# Patient Record
Sex: Female | Born: 1985 | Race: White | Hispanic: No | Marital: Married | Smoking: Never smoker
Health system: Southern US, Community
[De-identification: ages and names within clinical notes are randomized; demographics above are authoritative.]

## PROBLEM LIST (undated history)

## (undated) DIAGNOSIS — N281 Cyst of kidney, acquired: Secondary | ICD-10-CM

## (undated) DIAGNOSIS — E278 Other specified disorders of adrenal gland: Secondary | ICD-10-CM

## (undated) DIAGNOSIS — D649 Anemia, unspecified: Secondary | ICD-10-CM

## (undated) DIAGNOSIS — K900 Celiac disease: Secondary | ICD-10-CM

## (undated) HISTORY — DX: Cyst of kidney, acquired: N28.1

## (undated) HISTORY — PX: WISDOM TOOTH EXTRACTION: SHX21

## (undated) HISTORY — PX: BOWEL RESECTION: SHX1257

## (undated) HISTORY — PX: NO PAST SURGERIES: SHX2092

---

## 2019-03-07 LAB — OB RESULTS CONSOLE ANTIBODY SCREEN: Antibody Screen: NEGATIVE

## 2019-03-07 LAB — OB RESULTS CONSOLE GC/CHLAMYDIA
Chlamydia: NEGATIVE
Gonorrhea: NEGATIVE

## 2019-03-07 LAB — OB RESULTS CONSOLE ABO/RH: RH Type: POSITIVE

## 2019-03-07 LAB — OB RESULTS CONSOLE RUBELLA ANTIBODY, IGM: Rubella: IMMUNE

## 2019-03-07 LAB — OB RESULTS CONSOLE HEPATITIS B SURFACE ANTIGEN: Hepatitis B Surface Ag: NEGATIVE

## 2019-03-07 LAB — OB RESULTS CONSOLE RPR: RPR: NONREACTIVE

## 2019-03-07 LAB — OB RESULTS CONSOLE HIV ANTIBODY (ROUTINE TESTING): HIV: NONREACTIVE

## 2019-09-01 ENCOUNTER — Encounter (HOSPITAL_COMMUNITY): Payer: Self-pay | Admitting: Obstetrics and Gynecology

## 2019-09-01 ENCOUNTER — Inpatient Hospital Stay (HOSPITAL_COMMUNITY)
Admission: AD | Admit: 2019-09-01 | Discharge: 2019-09-01 | Disposition: A | Discharge status: Home / Self Care | Payer: BC Managed Care – PPO | Source: Ambulatory Visit | Attending: Obstetrics and Gynecology | Admitting: Obstetrics and Gynecology

## 2019-09-01 DIAGNOSIS — K9289 Other specified diseases of the digestive system: Secondary | ICD-10-CM | POA: Diagnosis not present

## 2019-09-01 DIAGNOSIS — O99613 Diseases of the digestive system complicating pregnancy, third trimester: Secondary | ICD-10-CM | POA: Diagnosis not present

## 2019-09-01 DIAGNOSIS — Z3689 Encounter for other specified antenatal screening: Secondary | ICD-10-CM

## 2019-09-01 DIAGNOSIS — Z3A34 34 weeks gestation of pregnancy: Secondary | ICD-10-CM | POA: Diagnosis not present

## 2019-09-01 DIAGNOSIS — O368131 Decreased fetal movements, third trimester, fetus 1: Secondary | ICD-10-CM | POA: Diagnosis present

## 2019-09-01 HISTORY — DX: Celiac disease: K90.0

## 2019-09-01 NOTE — MAU Provider Note (Addendum)
Patient [name] is a 34 y.o. G1P0  at 41w4dhere with complaints of decreased fetal movements. She denies vaginal bleeding, dysuria, pelvic pain, HA, blurry vision, floating spots, COVID like symptoms.   She has had an uncomplicated pregnancy thus far; no history of high blood pressure or diabetes.   She reports a healthy dietary intake; she reports that she drinks enough water. She does not feel dehydrated.  History     CSN: [id number] Arrival date and time: 09/01/19 1754   None     Chief Complaint  Patient presents with  . 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 She started to worrying about fetal movements yesterday. She felt hiccups yesterday but she was on her feet all day. She took a unisom two nights and wondered if perhaps that had sedated the baby. She was at work and was concerned; she sat down at 3:45/4 and rested and tried to feel movements and didn't feel anything. She called her OB and they told her to come in and get checked out.   She reports that she "never stops or sits"; she is a very active. She thinks that is why she hasn't felt baby moved.    OB History    Gravida  1   Para      Term      Preterm      AB      Living        SAB      TAB      Ectopic      Multiple      Live Births              Past Medical History:  Diagnosis Date  . Celiac disease     Past Surgical History:  Procedure Laterality Date  . BOWEL RESECTION     as child  . NO PAST SURGERIES      History reviewed. No pertinent family history.  Social History   Tobacco Use  . Smoking status: Never Smoker  . Smokeless tobacco: Never Used  Substance Use Topics  . Alcohol use: Never  . Drug use: Never    Allergies: No Known Allergies  Medications Prior to Admission  Medication Sig Dispense Refill Last Dose  . prenatal vitamin w/FE, FA (PRENATAL 1  + 1) 27-1 MG TABS tablet Take 1 tablet by mouth daily at 12 noon.   08/31/2019 at Unknown time    Review of Systems  Constitutional: Negative.   HENT: Negative.   Respiratory: Negative.   Gastrointestinal: Negative.   Genitourinary: Negative.   Musculoskeletal: Negative.   Neurological: Negative.   Hematological: Negative.   Psychiatric/Behavioral: Negative.    Physical Exam   Blood pressure 128/77, pulse 82, temperature 97.6 F (36.4 C), temperature source Oral, resp. rate 18, SpO2 98 %.  Physical Exam  Constitutional: She appears well-developed.  HENT:  Head: Normocephalic.  Eyes: Pupils are equal, round, and reactive to light.  Respiratory: Effort normal.  GI: Soft.  Musculoskeletal:        General: Normal range of motion.     Cervical back: Normal range of motion.  Neurological: She is alert.  Skin: Skin is warm and dry.    MAU Course  Procedures  MDM -NST: 135 bpm, mod var, present acel, neg decels, irregular contractions.   - Reassurance and guidance given on fetal monitoring;  patient feels frequent strong active movements while in MAU.  Assessment and Plan   1. NST (non-stress test) reactive    2. Patient stable for discharge; her next prenatal visit is Tuesday; which she plans to keep.   3. Reviewed fetal movements, warning signs of labor, kick counts.   4. Chart routed to [facility].  Mervyn Skeeters Lori-Ann Lindfors 09/01/2019, 7:18 PM

## 2019-09-01 NOTE — MAU Note (Signed)
Pt presents to MAU with c/o DFM. She has noticed that the baby has not moved very much in the last 2 days. She reports that she has tried eating and drinking, has felt some movement but not normal movements. Pt denies VB and LOF.

## 2019-09-01 NOTE — Discharge Instructions (Signed)
Fetal Movement Counts Patient Name: ________________________________________________ Patient Due Date: ____________________ What is a fetal movement count?  A fetal movement count is the number of times that you feel your baby move during a certain amount of time. This may also be called a fetal kick count. A fetal movement count is recommended for every pregnant woman. You may be asked to start counting fetal movements as early as week 28 of your pregnancy. Pay attention to when your baby is most active. You may notice your baby's sleep and wake cycles. You may also notice things that make your baby move more. You should do a fetal movement count:  When your baby is normally most active.  At the same time each day. A good time to count movements is while you are resting, after having something to eat and drink. How do I count fetal movements? 1. Find a quiet, comfortable area. Sit, or lie down on your side. 2. Write down the date, the start time and stop time, and the number of movements that you felt between those two times. Take this information with you to your health care visits. 3. Write down your start time when you feel the first movement. 4. Count kicks, flutters, swishes, rolls, and jabs. You should feel at least 10 movements. 5. You may stop counting after you have felt 10 movements, or if you have been counting for 2 hours. Write down the stop time. 6. If you do not feel 10 movements in 2 hours, contact your health care provider for further instructions. Your health care provider may want to do additional tests to assess your baby's well-being. Contact a health care provider if:  You feel fewer than 10 movements in 2 hours.  Your baby is not moving like he or she usually does. Date: ____________ Start time: ____________ Stop time: ____________ Movements: ____________ Date: ____________ Start time: ____________ Stop time: ____________ Movements: ____________ Date: ____________  Start time: ____________ Stop time: ____________ Movements: ____________ Date: ____________ Start time: ____________ Stop time: ____________ Movements: ____________ Date: ____________ Start time: ____________ Stop time: ____________ Movements: ____________ Date: ____________ Start time: ____________ Stop time: ____________ Movements: ____________ Date: ____________ Start time: ____________ Stop time: ____________ Movements: ____________ Date: ____________ Start time: ____________ Stop time: ____________ Movements: ____________ Date: ____________ Start time: ____________ Stop time: ____________ Movements: ____________ This information is not intended to replace advice given to you by your health care provider. Make sure you discuss any questions you have with your health care provider. Document Revised: 02/23/2019 Document Reviewed: 02/23/2019 Elsevier Patient Education  [place].

## 2019-09-12 LAB — OB RESULTS CONSOLE GBS: GBS: NEGATIVE

## 2019-09-21 ENCOUNTER — Encounter (HOSPITAL_COMMUNITY): Payer: Self-pay

## 2019-09-21 NOTE — Patient Instructions (Signed)
[name]  09/21/2019   Your procedure is scheduled on:  10/04/2019  Arrive at 28 at [facility] on [address] at [facility]  and [employer]. You are invited to use the FREE valet parking or use the Visitor's parking deck.  Pick up the phone at the desk and dial [phone].  Call this number if you have problems the morning of surgery: [phone]  Remember:   Do not eat food:(After Midnight) Desps de medianoche.  Do not drink clear liquids: (After Midnight) Desps de medianoche.  Take these medicines the morning of surgery with A SIP OF WATER:  none   Do not wear jewelry, make-up or nail polish.  Do not wear lotions, powders, or perfumes. Do not wear deodorant.  Do not shave 48 hours prior to surgery.  Do not bring valuables to the hospital.  [facility] is not   responsible for any belongings or valuables brought to the hospital.  Contacts, dentures or bridgework may not be worn into surgery.  Leave suitcase in the car. After surgery it may be brought to your room.  For patients admitted to the hospital, checkout time is 11:00 AM the day of              discharge.      Please read over the following fact sheets that you were given:     Preparing for Surgery

## 2019-09-22 ENCOUNTER — Encounter (HOSPITAL_COMMUNITY): Payer: Self-pay

## 2019-09-22 ENCOUNTER — Telehealth (HOSPITAL_COMMUNITY): Payer: Self-pay | Admitting: *Deleted

## 2019-09-22 NOTE — Telephone Encounter (Signed)
Preadmission screen  

## 2019-10-02 ENCOUNTER — Other Ambulatory Visit (HOSPITAL_COMMUNITY)
Admission: RE | Admit: 2019-10-02 | Discharge: 2019-10-02 | Disposition: A | Discharge status: Home / Self Care | Payer: BC Managed Care – PPO | Source: Ambulatory Visit | Attending: Obstetrics and Gynecology | Admitting: Obstetrics and Gynecology

## 2019-10-02 ENCOUNTER — Other Ambulatory Visit: Payer: Self-pay

## 2019-10-02 DIAGNOSIS — Z20822 Contact with and (suspected) exposure to covid-19: Secondary | ICD-10-CM | POA: Insufficient documentation

## 2019-10-02 DIAGNOSIS — Z01812 Encounter for preprocedural laboratory examination: Secondary | ICD-10-CM | POA: Diagnosis not present

## 2019-10-02 HISTORY — DX: Anemia, unspecified: D64.9

## 2019-10-02 LAB — CBC
HCT: 35.8 % — ABNORMAL LOW (ref 36.0–46.0)
Hemoglobin: 11.4 g/dL — ABNORMAL LOW (ref 12.0–15.0)
MCH: 27 pg (ref 26.0–34.0)
MCHC: 31.8 g/dL (ref 30.0–36.0)
MCV: 84.8 fL (ref 80.0–100.0)
Platelets: 249 10*3/uL (ref 150–400)
RBC: 4.22 MIL/uL (ref 3.87–5.11)
RDW: 13.6 % (ref 11.5–15.5)
WBC: 5.9 10*3/uL (ref 4.0–10.5)
nRBC: 0 % (ref 0.0–0.2)

## 2019-10-02 LAB — ABO/RH: ABO/RH(D): O POS

## 2019-10-02 LAB — SARS CORONAVIRUS 2 (TAT 6-24 HRS): SARS Coronavirus 2: NEGATIVE

## 2019-10-02 LAB — TYPE AND SCREEN
ABO/RH(D): O POS
Antibody Screen: NEGATIVE

## 2019-10-02 LAB — RPR: RPR Ser Ql: NONREACTIVE

## 2019-10-02 NOTE — MAU Note (Signed)
Asymptomatic, swab collected.  Lab called.

## 2019-10-04 ENCOUNTER — Inpatient Hospital Stay (HOSPITAL_COMMUNITY)
Admission: RE | Admit: 2019-10-04 | Payer: BC Managed Care – PPO | Source: Home / Self Care | Admitting: Obstetrics and Gynecology

## 2019-10-04 ENCOUNTER — Telehealth (HOSPITAL_COMMUNITY): Payer: Self-pay | Admitting: *Deleted

## 2019-10-04 ENCOUNTER — Encounter (HOSPITAL_COMMUNITY): Admission: RE | Payer: Self-pay | Source: Home / Self Care

## 2019-10-04 SURGERY — Surgical Case
Anesthesia: Regional

## 2019-10-04 NOTE — Telephone Encounter (Signed)
Preadmission screen  

## 2019-10-06 NOTE — H&P (Signed)
[name] is a 34 y.o. female presenting for IOL at term. Pregnancy complicated by Hx of celiac disease. She was scheduled for C/S due to breech. U/S in office 10/03/19 noted Vtx. OB History    Gravida  1   Para      Term      Preterm      AB      Living        SAB      TAB      Ectopic      Multiple      Live Births             Past Medical History:  Diagnosis Date  . Anemia   . Celiac disease    Past Surgical History:  Procedure Laterality Date  . BOWEL RESECTION     as child  . NO PAST SURGERIES    . WISDOM TOOTH EXTRACTION     Family History: family history includes Cancer in her mother. Social History:  reports that she has never smoked. She has never used smokeless tobacco. She reports that she does not drink alcohol or use drugs.     Maternal Diabetes: No Genetic Screening: Normal Maternal Ultrasounds/Referrals: Normal Fetal Ultrasounds or other Referrals:  None Maternal Substance Abuse:  No Significant Maternal Medications:  None Significant Maternal Lab Results:  Group B Strep negative Other Comments:  None  Review of Systems  Eyes: Negative for visual disturbance.  Gastrointestinal: Negative for abdominal pain.  Neurological: Negative for headaches.   Maternal Medical History:  Fetal activity: Perceived fetal activity is normal.        There were no vitals taken for this visit. Exam Physical Exam  Cardiovascular: Normal rate.  Respiratory: Effort normal.  GI: Soft.   Cx 0/thick/-2 in office 10/03/19  Prenatal labs: ABO, Rh: --/--/O POS, O POS Performed at [facility], [place] [address], [place], [place] [address]  [phone]) Antibody: NEG (03/15 0848) Rubella: Immune (08/18 0000) RPR: NON REACTIVE (03/15 0846)  HBsAg: Negative (08/18 0000)  HIV: Non-reactive (08/18 0000)  GBS: Negative/-- (02/23 0000)   Assessment/Plan: 34 yo for two stage IOL   Shon Millet II 10/06/2019, 1:54 PM

## 2019-10-07 ENCOUNTER — Other Ambulatory Visit (HOSPITAL_COMMUNITY)
Admission: RE | Admit: 2019-10-07 | Discharge: 2019-10-07 | Disposition: A | Discharge status: Home / Self Care | Payer: BC Managed Care – PPO | Source: Ambulatory Visit | Attending: Obstetrics and Gynecology | Admitting: Obstetrics and Gynecology

## 2019-10-07 DIAGNOSIS — Z01812 Encounter for preprocedural laboratory examination: Secondary | ICD-10-CM | POA: Diagnosis not present

## 2019-10-07 DIAGNOSIS — Z20822 Contact with and (suspected) exposure to covid-19: Secondary | ICD-10-CM | POA: Insufficient documentation

## 2019-10-07 LAB — SARS CORONAVIRUS 2 (TAT 6-24 HRS): SARS Coronavirus 2: NEGATIVE

## 2019-10-09 ENCOUNTER — Encounter (HOSPITAL_COMMUNITY)
Admission: AD | Disposition: A | Discharge status: Home / Self Care | Payer: Self-pay | Source: Home / Self Care | Attending: Obstetrics and Gynecology

## 2019-10-09 ENCOUNTER — Encounter (HOSPITAL_COMMUNITY): Payer: Self-pay | Admitting: Obstetrics and Gynecology

## 2019-10-09 ENCOUNTER — Inpatient Hospital Stay (HOSPITAL_COMMUNITY): Payer: BC Managed Care – PPO

## 2019-10-09 ENCOUNTER — Inpatient Hospital Stay (HOSPITAL_COMMUNITY)
Admission: AD | Admit: 2019-10-09 | Discharge: 2019-10-11 | DRG: 788 | Disposition: A | Discharge status: Home / Self Care | Payer: BC Managed Care – PPO | Attending: Obstetrics and Gynecology | Admitting: Obstetrics and Gynecology

## 2019-10-09 ENCOUNTER — Inpatient Hospital Stay (HOSPITAL_COMMUNITY): Payer: BC Managed Care – PPO | Admitting: Anesthesiology

## 2019-10-09 ENCOUNTER — Other Ambulatory Visit: Payer: Self-pay

## 2019-10-09 DIAGNOSIS — O9962 Diseases of the digestive system complicating childbirth: Secondary | ICD-10-CM | POA: Diagnosis present

## 2019-10-09 DIAGNOSIS — O9902 Anemia complicating childbirth: Secondary | ICD-10-CM | POA: Diagnosis present

## 2019-10-09 DIAGNOSIS — D649 Anemia, unspecified: Secondary | ICD-10-CM | POA: Diagnosis present

## 2019-10-09 DIAGNOSIS — O328XX0 Maternal care for other malpresentation of fetus, not applicable or unspecified: Secondary | ICD-10-CM | POA: Diagnosis present

## 2019-10-09 DIAGNOSIS — Z3A40 40 weeks gestation of pregnancy: Secondary | ICD-10-CM

## 2019-10-09 DIAGNOSIS — Z3490 Encounter for supervision of normal pregnancy, unspecified, unspecified trimester: Secondary | ICD-10-CM

## 2019-10-09 DIAGNOSIS — O26893 Other specified pregnancy related conditions, third trimester: Secondary | ICD-10-CM | POA: Diagnosis present

## 2019-10-09 DIAGNOSIS — K900 Celiac disease: Secondary | ICD-10-CM | POA: Diagnosis present

## 2019-10-09 LAB — CBC
HCT: 36.4 % (ref 36.0–46.0)
HCT: 32.9 % — ABNORMAL LOW (ref 36.0–46.0)
Hemoglobin: 11.8 g/dL — ABNORMAL LOW (ref 12.0–15.0)
Hemoglobin: 10.8 g/dL — ABNORMAL LOW (ref 12.0–15.0)
MCH: 27.4 pg (ref 26.0–34.0)
MCH: 27.8 pg (ref 26.0–34.0)
MCHC: 32.4 g/dL (ref 30.0–36.0)
MCHC: 32.8 g/dL (ref 30.0–36.0)
MCV: 84.7 fL (ref 80.0–100.0)
MCV: 84.6 fL (ref 80.0–100.0)
Platelets: 255 10*3/uL (ref 150–400)
Platelets: 247 10*3/uL (ref 150–400)
RBC: 4.3 MIL/uL (ref 3.87–5.11)
RBC: 3.89 MIL/uL (ref 3.87–5.11)
RDW: 13.9 % (ref 11.5–15.5)
RDW: 13.9 % (ref 11.5–15.5)
WBC: 7.9 10*3/uL (ref 4.0–10.5)
WBC: 13.4 10*3/uL — ABNORMAL HIGH (ref 4.0–10.5)
nRBC: 0 % (ref 0.0–0.2)
nRBC: 0 % (ref 0.0–0.2)

## 2019-10-09 LAB — RPR: RPR Ser Ql: NONREACTIVE

## 2019-10-09 LAB — TYPE AND SCREEN
ABO/RH(D): O POS
Antibody Screen: NEGATIVE

## 2019-10-09 SURGERY — Surgical Case
Anesthesia: Spinal | Wound class: Clean Contaminated

## 2019-10-09 MED ORDER — METOCLOPRAMIDE HCL 5 MG/ML IJ SOLN
INTRAMUSCULAR | Status: AC
  Filled 2019-10-09: qty 2

## 2019-10-09 MED ORDER — OXYCODONE-ACETAMINOPHEN 5-325 MG PO TABS: 2.0000 | ORAL_TABLET | ORAL | Status: DC | PRN

## 2019-10-09 MED ORDER — OXYTOCIN 40 UNITS IN NORMAL SALINE INFUSION - SIMPLE MED: INTRAVENOUS | Status: DC | PRN

## 2019-10-09 MED ORDER — FENTANYL CITRATE (PF) 100 MCG/2ML IJ SOLN
INTRAMUSCULAR | Status: AC
  Filled 2019-10-09: qty 2

## 2019-10-09 MED ORDER — DIBUCAINE (PERIANAL) 1 % EX OINT: 1.0000 "application " | TOPICAL_OINTMENT | CUTANEOUS | Status: DC | PRN

## 2019-10-09 MED ORDER — NALBUPHINE HCL 10 MG/ML IJ SOLN: 5.0000 mg | Freq: Once | INTRAMUSCULAR | Status: DC | PRN

## 2019-10-09 MED ORDER — OXYTOCIN 40 UNITS IN NORMAL SALINE INFUSION - SIMPLE MED
INTRAVENOUS | Status: AC
  Filled 2019-10-09: qty 1000

## 2019-10-09 MED ORDER — MISOPROSTOL 25 MCG QUARTER TABLET: 25.0000 ug | ORAL_TABLET | ORAL | Status: DC | PRN

## 2019-10-09 MED ORDER — SIMETHICONE 80 MG PO CHEW
80.0000 mg | CHEWABLE_TABLET | Freq: Three times a day (TID) | ORAL | Status: DC
  Administered 2019-10-09 – 2019-10-11 (×7): 80 mg via ORAL
  Filled 2019-10-09 (×7): qty 1

## 2019-10-09 MED ORDER — FENTANYL CITRATE (PF) 100 MCG/2ML IJ SOLN
INTRAMUSCULAR | Status: DC | PRN
  Administered 2019-10-09: 15 ug via INTRATHECAL

## 2019-10-09 MED ORDER — DIPHENHYDRAMINE HCL 50 MG/ML IJ SOLN
INTRAMUSCULAR | Status: AC
  Filled 2019-10-09: qty 1

## 2019-10-09 MED ORDER — PHENYLEPHRINE 40 MCG/ML (10ML) SYRINGE FOR IV PUSH (FOR BLOOD PRESSURE SUPPORT)
PREFILLED_SYRINGE | INTRAVENOUS | Status: AC
  Filled 2019-10-09: qty 10

## 2019-10-09 MED ORDER — NALOXONE HCL 0.4 MG/ML IJ SOLN: 0.4000 mg | INTRAMUSCULAR | Status: DC | PRN

## 2019-10-09 MED ORDER — DIPHENHYDRAMINE HCL 50 MG/ML IJ SOLN: 12.5000 mg | INTRAMUSCULAR | Status: DC | PRN

## 2019-10-09 MED ORDER — METOCLOPRAMIDE HCL 5 MG/ML IJ SOLN
INTRAMUSCULAR | Status: DC | PRN
  Administered 2019-10-09: 10 mg via INTRAVENOUS

## 2019-10-09 MED ORDER — EPHEDRINE 5 MG/ML INJ
INTRAVENOUS | Status: AC
  Filled 2019-10-09: qty 10

## 2019-10-09 MED ORDER — DIPHENHYDRAMINE HCL 25 MG PO CAPS: 25.0000 mg | ORAL_CAPSULE | ORAL | Status: DC | PRN

## 2019-10-09 MED ORDER — SODIUM CHLORIDE 0.9 % IV SOLN: INTRAVENOUS | Status: DC | PRN

## 2019-10-09 MED ORDER — ZOLPIDEM TARTRATE 5 MG PO TABS: 5.0000 mg | ORAL_TABLET | Freq: Every evening | ORAL | Status: DC | PRN

## 2019-10-09 MED ORDER — ONDANSETRON HCL 4 MG/2ML IJ SOLN
INTRAMUSCULAR | Status: AC
  Filled 2019-10-09: qty 2

## 2019-10-09 MED ORDER — ACETAMINOPHEN 500 MG PO TABS
1000.0000 mg | ORAL_TABLET | Freq: Four times a day (QID) | ORAL | Status: DC
  Administered 2019-10-09 – 2019-10-11 (×9): 1000 mg via ORAL
  Filled 2019-10-09 (×10): qty 2

## 2019-10-09 MED ORDER — STERILE WATER FOR IRRIGATION IR SOLN
Status: DC | PRN
  Administered 2019-10-09: 1000 mL

## 2019-10-09 MED ORDER — LACTATED RINGERS IV SOLN: INTRAVENOUS | Status: DC | PRN

## 2019-10-09 MED ORDER — CEFAZOLIN SODIUM-DEXTROSE 2-4 GM/100ML-% IV SOLN
2.0000 g | Freq: Once | INTRAVENOUS | Status: AC
  Administered 2019-10-09: 2 g via INTRAVENOUS

## 2019-10-09 MED ORDER — WITCH HAZEL-GLYCERIN EX PADS: 1.0000 "application " | MEDICATED_PAD | CUTANEOUS | Status: DC | PRN

## 2019-10-09 MED ORDER — OXYTOCIN 40 UNITS IN NORMAL SALINE INFUSION - SIMPLE MED: 2.5000 [IU]/h | INTRAVENOUS | Status: AC

## 2019-10-09 MED ORDER — SIMETHICONE 80 MG PO CHEW
80.0000 mg | CHEWABLE_TABLET | ORAL | Status: DC
  Administered 2019-10-09 – 2019-10-10 (×2): 80 mg via ORAL
  Filled 2019-10-09 (×2): qty 1

## 2019-10-09 MED ORDER — MORPHINE SULFATE (PF) 0.5 MG/ML IJ SOLN
INTRAMUSCULAR | Status: AC
  Filled 2019-10-09: qty 10

## 2019-10-09 MED ORDER — OXYCODONE HCL 5 MG PO TABS
5.0000 mg | ORAL_TABLET | ORAL | Status: DC | PRN
  Administered 2019-10-10 – 2019-10-11 (×4): 5 mg via ORAL
  Filled 2019-10-09 (×4): qty 1

## 2019-10-09 MED ORDER — TETANUS-DIPHTH-ACELL PERTUSSIS 5-2.5-18.5 LF-MCG/0.5 IM SUSP: 0.5000 mL | Freq: Once | INTRAMUSCULAR | Status: DC

## 2019-10-09 MED ORDER — IBUPROFEN 800 MG PO TABS
800.0000 mg | ORAL_TABLET | Freq: Four times a day (QID) | ORAL | Status: DC
  Administered 2019-10-10 – 2019-10-11 (×6): 800 mg via ORAL
  Filled 2019-10-09 (×6): qty 1

## 2019-10-09 MED ORDER — ONDANSETRON HCL 4 MG/2ML IJ SOLN
INTRAMUSCULAR | Status: DC | PRN
  Administered 2019-10-09: 4 mg via INTRAVENOUS

## 2019-10-09 MED ORDER — TERBUTALINE SULFATE 1 MG/ML IJ SOLN: 0.2500 mg | Freq: Once | INTRAMUSCULAR | Status: DC | PRN

## 2019-10-09 MED ORDER — LACTATED RINGERS IV SOLN: 500.0000 mL | INTRAVENOUS | Status: DC | PRN

## 2019-10-09 MED ORDER — FENTANYL CITRATE (PF) 100 MCG/2ML IJ SOLN: 50.0000 ug | INTRAMUSCULAR | Status: DC | PRN

## 2019-10-09 MED ORDER — NALOXONE HCL 4 MG/10ML IJ SOLN
1.0000 ug/kg/h | INTRAVENOUS | Status: DC | PRN
  Filled 2019-10-09: qty 5

## 2019-10-09 MED ORDER — MORPHINE SULFATE (PF) 0.5 MG/ML IJ SOLN
INTRAMUSCULAR | Status: DC | PRN
  Administered 2019-10-09: .15 mg via INTRATHECAL

## 2019-10-09 MED ORDER — BUPIVACAINE IN DEXTROSE 0.75-8.25 % IT SOLN
INTRATHECAL | Status: DC | PRN
  Administered 2019-10-09: 1.5 mL via INTRATHECAL

## 2019-10-09 MED ORDER — PHENYLEPHRINE HCL-NACL 20-0.9 MG/250ML-% IV SOLN
INTRAVENOUS | Status: AC
  Filled 2019-10-09: qty 250

## 2019-10-09 MED ORDER — LACTATED RINGERS IV SOLN: INTRAVENOUS | Status: DC

## 2019-10-09 MED ORDER — KETOROLAC TROMETHAMINE 30 MG/ML IJ SOLN: 30.0000 mg | Freq: Four times a day (QID) | INTRAMUSCULAR | Status: DC | PRN

## 2019-10-09 MED ORDER — OXYCODONE-ACETAMINOPHEN 5-325 MG PO TABS: 1.0000 | ORAL_TABLET | ORAL | Status: DC | PRN

## 2019-10-09 MED ORDER — MORPHINE SULFATE (PF) 0.5 MG/ML IJ SOLN: INTRAMUSCULAR | Status: DC | PRN

## 2019-10-09 MED ORDER — ONDANSETRON HCL 4 MG/2ML IJ SOLN: 4.0000 mg | Freq: Three times a day (TID) | INTRAMUSCULAR | Status: DC | PRN

## 2019-10-09 MED ORDER — LACTATED RINGERS IV SOLN
INTRAVENOUS | Status: DC
  Administered 2019-10-09: 125 mL/h via INTRAVENOUS

## 2019-10-09 MED ORDER — NALBUPHINE HCL 10 MG/ML IJ SOLN: 5.0000 mg | INTRAMUSCULAR | Status: DC | PRN

## 2019-10-09 MED ORDER — DIPHENHYDRAMINE HCL 50 MG/ML IJ SOLN
INTRAMUSCULAR | Status: DC | PRN
  Administered 2019-10-09: 25 mg via INTRAVENOUS

## 2019-10-09 MED ORDER — SCOPOLAMINE 1 MG/3DAYS TD PT72
1.0000 | MEDICATED_PATCH | Freq: Once | TRANSDERMAL | Status: DC
  Administered 2019-10-09: 1.5 mg via TRANSDERMAL
  Filled 2019-10-09: qty 1

## 2019-10-09 MED ORDER — OXYTOCIN BOLUS FROM INFUSION: 500.0000 mL | Freq: Once | INTRAVENOUS | Status: DC

## 2019-10-09 MED ORDER — EPHEDRINE SULFATE 50 MG/ML IJ SOLN
INTRAMUSCULAR | Status: DC | PRN
  Administered 2019-10-09: 5 mg via INTRAVENOUS

## 2019-10-09 MED ORDER — MENTHOL 3 MG MT LOZG: 1.0000 | LOZENGE | OROMUCOSAL | Status: DC | PRN

## 2019-10-09 MED ORDER — SENNOSIDES-DOCUSATE SODIUM 8.6-50 MG PO TABS
2.0000 | ORAL_TABLET | ORAL | Status: DC
  Administered 2019-10-09 – 2019-10-10 (×2): 2 via ORAL
  Filled 2019-10-09 (×2): qty 2

## 2019-10-09 MED ORDER — SOD CITRATE-CITRIC ACID 500-334 MG/5ML PO SOLN
30.0000 mL | ORAL | Status: DC | PRN
  Administered 2019-10-09: 30 mL via ORAL
  Filled 2019-10-09: qty 30

## 2019-10-09 MED ORDER — OXYTOCIN 40 UNITS IN NORMAL SALINE INFUSION - SIMPLE MED: 2.5000 [IU]/h | INTRAVENOUS | Status: DC

## 2019-10-09 MED ORDER — SODIUM CHLORIDE 0.9 % IR SOLN
Status: DC | PRN
  Administered 2019-10-09: 1000 mL

## 2019-10-09 MED ORDER — SIMETHICONE 80 MG PO CHEW: 80.0000 mg | CHEWABLE_TABLET | ORAL | Status: DC | PRN

## 2019-10-09 MED ORDER — KETOROLAC TROMETHAMINE 30 MG/ML IJ SOLN
30.0000 mg | Freq: Four times a day (QID) | INTRAMUSCULAR | Status: AC
  Administered 2019-10-09 (×4): 30 mg via INTRAVENOUS
  Filled 2019-10-09 (×4): qty 1

## 2019-10-09 MED ORDER — FLEET ENEMA 7-19 GM/118ML RE ENEM: 1.0000 | ENEMA | RECTAL | Status: DC | PRN

## 2019-10-09 MED ORDER — ONDANSETRON HCL 4 MG/2ML IJ SOLN: 4.0000 mg | Freq: Four times a day (QID) | INTRAMUSCULAR | Status: DC | PRN

## 2019-10-09 MED ORDER — ACETAMINOPHEN 325 MG PO TABS: 650.0000 mg | ORAL_TABLET | ORAL | Status: DC | PRN

## 2019-10-09 MED ORDER — DIPHENHYDRAMINE HCL 25 MG PO CAPS: 25.0000 mg | ORAL_CAPSULE | Freq: Four times a day (QID) | ORAL | Status: DC | PRN

## 2019-10-09 MED ORDER — HYDROMORPHONE HCL 1 MG/ML IJ SOLN: 0.2000 mg | INTRAMUSCULAR | Status: DC | PRN

## 2019-10-09 MED ORDER — PHENYLEPHRINE HCL-NACL 20-0.9 MG/250ML-% IV SOLN
INTRAVENOUS | Status: DC | PRN
  Administered 2019-10-09: 30 ug/min via INTRAVENOUS

## 2019-10-09 MED ORDER — OXYTOCIN 10 UNIT/ML IJ SOLN
INTRAMUSCULAR | Status: DC | PRN
  Administered 2019-10-09: 40 [IU] via INTRAMUSCULAR

## 2019-10-09 MED ORDER — LIDOCAINE HCL (PF) 1 % IJ SOLN: 30.0000 mL | INTRAMUSCULAR | Status: DC | PRN

## 2019-10-09 MED ORDER — PRENATAL MULTIVITAMIN CH
1.0000 | ORAL_TABLET | Freq: Every day | ORAL | Status: DC
  Administered 2019-10-09 – 2019-10-11 (×3): 1 via ORAL
  Filled 2019-10-09 (×3): qty 1

## 2019-10-09 MED ORDER — COCONUT OIL OIL: 1.0000 "application " | TOPICAL_OIL | Status: DC | PRN

## 2019-10-09 MED ORDER — SODIUM CHLORIDE 0.9% FLUSH: 3.0000 mL | INTRAVENOUS | Status: DC | PRN

## 2019-10-09 SURGICAL SUPPLY — 37 items
BENZOIN TINCTURE PRP APPL 2/3 (GAUZE/BANDAGES/DRESSINGS) ×3 IMPLANT
CHLORAPREP W/TINT 26ML (MISCELLANEOUS) ×3 IMPLANT
CLAMP CORD UMBIL (MISCELLANEOUS) IMPLANT
CLOSURE STERI-STRIP 1/2X4 (GAUZE/BANDAGES/DRESSINGS) ×1
CLOSURE WOUND 1/2 X4 (GAUZE/BANDAGES/DRESSINGS) ×1
CLOTH BEACON ORANGE TIMEOUT ST (SAFETY) ×3 IMPLANT
CLSR STERI-STRIP ANTIMIC 1/2X4 (GAUZE/BANDAGES/DRESSINGS) ×2 IMPLANT
DRSG OPSITE POSTOP 4X10 (GAUZE/BANDAGES/DRESSINGS) ×3 IMPLANT
ELECT REM PT RETURN 9FT ADLT (ELECTROSURGICAL) ×3
ELECTRODE REM PT RTRN 9FT ADLT (ELECTROSURGICAL) ×1 IMPLANT
EXTRACTOR VACUUM KIWI (MISCELLANEOUS) IMPLANT
GAUZE SPONGE 4X4 12PLY STRL LF (GAUZE/BANDAGES/DRESSINGS) ×6 IMPLANT
GLOVE BIO SURGEON STRL SZ 6.5 (GLOVE) ×2 IMPLANT
GLOVE BIO SURGEONS STRL SZ 6.5 (GLOVE) ×1
GLOVE BIOGEL PI IND STRL 6.5 (GLOVE) ×1 IMPLANT
GLOVE BIOGEL PI IND STRL 7.0 (GLOVE) ×2 IMPLANT
GLOVE BIOGEL PI INDICATOR 6.5 (GLOVE) ×2
GLOVE BIOGEL PI INDICATOR 7.0 (GLOVE) ×4
GOWN STRL REUS W/TWL LRG LVL3 (GOWN DISPOSABLE) ×6 IMPLANT
KIT ABG SYR 3ML LUER SLIP (SYRINGE) ×3 IMPLANT
NEEDLE HYPO 25X5/8 SAFETYGLIDE (NEEDLE) ×3 IMPLANT
NS IRRIG 1000ML POUR BTL (IV SOLUTION) ×3 IMPLANT
PACK C SECTION WH (CUSTOM PROCEDURE TRAY) ×3 IMPLANT
PAD ABD 7.5X8 STRL (GAUZE/BANDAGES/DRESSINGS) ×3 IMPLANT
PAD OB MATERNITY 4.3X12.25 (PERSONAL CARE ITEMS) ×3 IMPLANT
PENCIL SMOKE EVAC W/HOLSTER (ELECTROSURGICAL) ×3 IMPLANT
STRIP CLOSURE SKIN 1/2X4 (GAUZE/BANDAGES/DRESSINGS) ×2 IMPLANT
SUT PLAIN 0 NONE (SUTURE) IMPLANT
SUT PLAIN 2 0 (SUTURE) ×2
SUT PLAIN ABS 2-0 CT1 27XMFL (SUTURE) ×1 IMPLANT
SUT VIC AB 0 CT1 36 (SUTURE) ×3 IMPLANT
SUT VIC AB 0 CTX 36 (SUTURE) ×6
SUT VIC AB 0 CTX36XBRD ANBCTRL (SUTURE) ×3 IMPLANT
SUT VIC AB 4-0 PS2 27 (SUTURE) ×3 IMPLANT
TOWEL OR 17X24 6PK STRL BLUE (TOWEL DISPOSABLE) ×3 IMPLANT
TRAY FOLEY W/BAG SLVR 14FR LF (SET/KITS/TRAYS/PACK) IMPLANT
WATER STERILE IRR 1000ML POUR (IV SOLUTION) ×3 IMPLANT

## 2019-10-09 NOTE — Op Note (Signed)
PROCEDURE DATE: 10/09/19  PREOPERATIVE DIAGNOSIS: Intrauterine pregnancy at 40.0 wga, Indication: breech  POSTOPERATIVE DIAGNOSIS:The same  PROCEDURE: Primary Low TransverseCesarean Section  SURGEON: Dr. Lucillie Garfinkel  INDICATIONS:This is a 34yo G1P0 at 26.0 wga requiring cesarean section secondary to breech.  She presented for an IOL. At that time presenting part unable to be palpated. BSUS done and confirmed breech presentation with head in RUQ. She was counseled on ECV vs cesarean section and elected ECV.  Decision made to proceed with pLTCS.The risks of cesarean section discussed with the patient included but were not limited to: bleeding which may require transfusion or reoperation; infection which may require antibiotics; injury to bowel, bladder, ureters or other surrounding organs; injury to the fetus; need for additional procedures including hysterectomy in the event of a life-threatening hemorrhage; placental abnormalities wth subsequent pregnancies, incisional problems, thromboembolic phenomenon and other postoperative/anesthesia complications. The patient agreed with the proposed plan, giving informed consent for the procedure.   FINDINGS: Viable maleinfant in footling breech presentation,APGARs pending, Weight pending, Amniotic fluid clear, Intact placenta, three vessel cord. Grossly normal uterus.  .  ANESTHESIA: Epidural ESTIMATED BLOOD LOSS: final pending SPECIMENS: Placenta for routine COMPLICATIONS: None immediate  PROCEDURE IN DETAIL: The patient received intravenous antibiotics (2g Ancef) and had sequential compression devices applied to her lower extremities while in the preoperative area. Shewasthen taken to the operating roomwhere epidural anesthesiawas dosed up to surgical level andwas found to be adequate. She was then placed in a dorsal supine position with a leftward tilt,and prepped and draped in a sterile manner.A foley catheter was  placed into her bladder and attached to constant gravity. After an adequate timeout was performed, aPfannenstiel skin incision was made with scalpel and carried through to the underlying layer of fascia. The fascia was incised in the midline and this incision was extended bilaterally using the Mayo scissors. Kocher clamps were applied to the superior aspect of the fascial incision and the underlying rectus muscles were dissected off bluntly. A similar process was carried out on the inferior aspect of the facial incision. The rectus muscles were separated in the midline bluntly and the peritoneum was entered bluntly. A bladder flap was created sharply and developed bluntly.Atransverse hysterotomy was made with a scalpel and extended bilaterally bluntly. The bladder blade was then removed. The infant was successfully delivered via typical breech manuevers, and cord was clamped and cut and infant was handed over to awaiting neonatology team. Uterine massage was then administered and the placenta delivered intact with three-vessel cord. Cord gases were taken. The uterus was cleared of clot and debris. The hysterotomy was closed with 0 vicryl.A second imbricating suture of 0-vicryl was used to reinforce the incision and aid in hemostasis.The fascia was closed with 0-Vicryl in a running fashion with good restoration of anatomy. The subcutaneus tissue was irrigated. The skin was closed with 4-0 Vicryl in a subcuticular fashion.  All surgical site and was hemostatic at end of procedure) without any further bleeding on exam.   It's a boy - "Parker"!!    Pt tolerated the procedure well. All sponge/lap/needle counts were correct X 2. Pt taken to recovery room in stable condition.   Lucillie Garfinkel MD

## 2019-10-09 NOTE — Progress Notes (Addendum)
Patient arrived and scanned. Noted to be breech with head in RUQ. We discussed options including EVC vs primary C section. She declines ECV and desires primary C section.  Risks discussed including infection, bleeding, damage to surrounding structures, the need for additional procedures including hysterectomy, and the possibility of uterine rupture with neonatal morbidity/mortality, scarring, and abnormal placentation with subsequent pregnancies. Patient agrees to proceed.  2g ancef on call to OR.    Lucillie Garfinkel MD

## 2019-10-09 NOTE — Lactation Note (Signed)
This note was copied from a baby's chart. Lactation Consultation Note Baby is 3 hrs old. Mom holding baby swaddled. LC discussed importance of STS and I&O. Newborn behavior, feeding habits, supply and demand reviewed. Mom encouraged to feed baby 8-12 times/24 hours and with feeding cues.  Mom encouraged to waken baby for feedings in 3 hrs. If hasn't cued.  Mom sleepy after RN comes back for next fundal check mom is going to rest. Mom stated baby BF well after delivery. Denies painful latch.  Encouraged mom to call for assistance or questions. Lactation brochure given.  Patient Name: [name] [id number] Date: 10/09/2019 Reason for consult: Initial assessment;Primapara;Term   Maternal Data Has patient been taught Hand Expression?: Yes Does the patient have breastfeeding experience prior to this delivery?: No  Feeding Feeding Type: Breast Fed  LATCH Score Latch: Repeated attempts needed to sustain latch, nipple held in mouth throughout feeding, stimulation needed to elicit sucking reflex.  Audible Swallowing: A few with stimulation  Type of Nipple: Everted at rest and after stimulation  Comfort (Breast/Nipple): Soft / non-tender  Hold (Positioning): Assistance needed to correctly position infant at breast and maintain latch.  LATCH Score: 7  Interventions Interventions: Breast feeding basics reviewed;Position options  Lactation Tools Discussed/Used WIC Program: No   Consult Status Consult Status: Follow-up Date: 10/09/19 Follow-up type: In-patient    Theodoro Kalata 10/09/2019, 6:14 AM

## 2019-10-09 NOTE — Anesthesia Preprocedure Evaluation (Signed)
Anesthesia Evaluation  Patient identified by MRN, date of birth, ID band Patient awake    Reviewed: Allergy & Precautions, H&P , NPO status , Patient's Chart, lab work & pertinent test results, reviewed documented beta blocker date and time   Airway Mallampati: II  TM Distance: >3 FB Neck ROM: full    Dental no notable dental hx. (+) Teeth Intact, Dental Advisory Given   Pulmonary neg pulmonary ROS,    Pulmonary exam normal breath sounds clear to auscultation       Cardiovascular negative cardio ROS Normal cardiovascular exam Rhythm:regular Rate:Normal     Neuro/Psych negative neurological ROS  negative psych ROS   GI/Hepatic negative GI ROS, Neg liver ROS,   Endo/Other  negative endocrine ROS  Renal/GU negative Renal ROS  negative genitourinary   Musculoskeletal   Abdominal   Peds  Hematology  (+) Blood dyscrasia, anemia ,   Anesthesia Other Findings   Reproductive/Obstetrics (+) Pregnancy                             Anesthesia Physical Anesthesia Plan  ASA: II and emergent  Anesthesia Plan: Spinal   Post-op Pain Management:    Induction:   PONV Risk Score and Plan: 2 and Scopolamine patch - Pre-op  Airway Management Planned:   Additional Equipment:   Intra-op Plan:   Post-operative Plan:   Informed Consent: I have reviewed the patients History and Physical, chart, labs and discussed the procedure including the risks, benefits and alternatives for the proposed anesthesia with the patient or authorized representative who has indicated his/her understanding and acceptance.       Plan Discussed with: Anesthesiologist and CRNA  Anesthesia Plan Comments: (  )        Anesthesia Quick Evaluation

## 2019-10-09 NOTE — Anesthesia Procedure Notes (Signed)
Spinal  Patient location during procedure: OR Start time: 10/09/2019 2:20 AM End time: 10/09/2019 2:22 AM Staffing Anesthesiologist: Janeece Riggers, MD Preanesthetic Checklist Completed: patient identified, IV checked, site marked, risks and benefits discussed, surgical consent, monitors and equipment checked, pre-op evaluation and timeout performed Spinal Block Patient position: sitting Prep: DuraPrep Patient monitoring: heart rate, cardiac monitor, continuous pulse ox and blood pressure Approach: midline Location: L3-4 Injection technique: single-shot Needle Needle type: Sprotte  Needle gauge: 24 G Needle length: 9 cm Assessment Sensory level: T4

## 2019-10-09 NOTE — Transfer of Care (Signed)
Immediate Anesthesia Transfer of Care Note  Patient: [name]  Procedure(s) Performed: CESAREAN SECTION (N/A )  Patient Location: PACU  Anesthesia Type:Spinal  Level of Consciousness: awake, alert  and oriented  Airway & Oxygen Therapy: Patient Spontanous Breathing  Post-op Assessment: Post -op Vital signs reviewed and stable  Post vital signs: Reviewed and stable  Last Vitals:  Vitals Value Taken Time  BP 101/56 10/09/19 0328  Temp    Pulse 74 10/09/19 0330  Resp 20 10/09/19 0330  SpO2 100 % 10/09/19 0330  Vitals shown include unvalidated device data.  Last Pain:  Vitals:   10/09/19 0056  TempSrc:   PainSc: 0-No pain         Complications: No apparent anesthesia complications

## 2019-10-09 NOTE — Plan of Care (Signed)

## 2019-10-09 NOTE — Progress Notes (Signed)
Subjective: Postpartum Day 0: Cesarean Delivery Patient reports tolerating PO.    Objective: Vital signs in last 24 hours: Temp:  [97.2 F (36.2 C)-98.9 F (37.2 C)] 97.7 F (36.5 C) (03/22 0612) Pulse Rate:  [61-80] 68 (03/22 0612) Resp:  [14-23] 18 (03/22 0612) BP: (101-146)/(56-98) 126/77 (03/22 0612) SpO2:  [99 %-100 %] 99 % (03/22 0612) Weight:  [68.4 kg] 68.4 kg (03/22 0056)  Physical Exam:  General: alert, cooperative and no distress Lochia: appropriate Uterine Fundus: firm Incision: healing well DVT Evaluation: No evidence of DVT seen on physical exam.  Recent Labs    10/09/19 0030  HGB 11.8*  HCT 36.4    Assessment/Plan: Status post Cesarean section. Doing well postoperatively.  Continue current care.  Shon Millet II 10/09/2019, 7:13 AM

## 2019-10-10 NOTE — Anesthesia Postprocedure Evaluation (Signed)
Anesthesia Post Note  Patient: Deirdre Gryder  Procedure(s) Performed: CESAREAN SECTION (N/A )     Patient location during evaluation: PACU Anesthesia Type: Spinal Level of consciousness: oriented and awake and alert Pain management: pain level controlled Vital Signs Assessment: post-procedure vital signs reviewed and stable Respiratory status: spontaneous breathing, respiratory function stable and patient connected to nasal cannula oxygen Cardiovascular status: blood pressure returned to baseline and stable Postop Assessment: no headache, no backache and no apparent nausea or vomiting Anesthetic complications: no    Last Vitals:  Vitals:   10/09/19 2130 10/10/19 0500  BP: 104/67 110/77  Pulse: 60 60  Resp: 17 17  Temp:  36.7 C  SpO2: 99% 100%    Last Pain:  Vitals:   10/10/19 0500  TempSrc: Oral  PainSc: 0-No pain   Pain Goal:                   Gustaf Mccarter

## 2019-10-10 NOTE — Lactation Note (Signed)
This note was copied from a baby's chart. Lactation Consultation Note  Patient Name: [name] [id number] Date: 10/10/2019 Reason for consult: Follow-up assessment;1st time breastfeeding;Primapara;Term;Other (Comment)(post circ - sleepy)  Baby is 82 hours old / post circ / baby awake for short time to attempt to latch.  Mom requested try to latch / football position/ baby latched for short time and off .  Baby STS with mom.  LC described to mom post circ potential feedings .    Maternal Data Has patient been taught Hand Expression?: Yes  Feeding Feeding Type: Breast Fed  LATCH Score Latch: Repeated attempts needed to sustain latch, nipple held in mouth throughout feeding, stimulation needed to elicit sucking reflex.  Audible Swallowing: None  Type of Nipple: Everted at rest and after stimulation  Comfort (Breast/Nipple): Soft / non-tender  Hold (Positioning): Assistance needed to correctly position infant at breast and maintain latch.  LATCH Score: 6  Interventions Interventions: Breast feeding basics reviewed;Assisted with latch;Skin to skin;Breast massage;Hand express;Breast compression;Adjust position;Support pillows;Position options  Lactation Tools Discussed/Used     Consult Status Consult Status: Follow-up Date: 10/11/19 Follow-up type: In-patient    [place] 10/10/2019, 6:05 PM

## 2019-10-10 NOTE — Progress Notes (Signed)
Subjective: Postpartum Day 1: Cesarean Delivery Patient reports incisional pain, tolerating PO and no problems voiding.    Objective: Vital signs in last 24 hours: Temp:  [98 F (36.7 C)-98.3 F (36.8 C)] 98 F (36.7 C) (03/23 0500) Pulse Rate:  [60] 60 (03/23 0500) Resp:  [16-17] 17 (03/23 0500) BP: (104-110)/(59-77) 110/77 (03/23 0500) SpO2:  [98 %-100 %] 100 % (03/23 0500)  Physical Exam:  General: alert, cooperative, appears stated age and no distress Lochia: appropriate Uterine Fundus: firm Incision: healing well DVT Evaluation: No evidence of DVT seen on physical exam.  Recent Labs    10/09/19 0030 10/09/19 0606  HGB 11.8* 10.8*  HCT 36.4 32.9*    Assessment/Plan: Status post Cesarean section. Doing well postoperatively.  Continue current care Pt desires baby circumcision today.  [name] 10/10/2019, 9:32 AM

## 2019-10-10 NOTE — Progress Notes (Signed)
Dressing change performed.  Incision clean, dry, and intact with edges approximated.  New dressing applied without difficulty.

## 2019-10-11 MED ORDER — OXYCODONE HCL 5 MG PO TABS: 5.0000 mg | ORAL_TABLET | ORAL | 0 refills | Status: DC | PRN

## 2019-10-11 MED ORDER — IBUPROFEN 800 MG PO TABS: 800.0000 mg | ORAL_TABLET | Freq: Four times a day (QID) | ORAL | 0 refills | Status: DC

## 2019-10-11 MED ORDER — ACETAMINOPHEN 500 MG PO TABS: 1000.0000 mg | ORAL_TABLET | Freq: Four times a day (QID) | ORAL | 0 refills | Status: DC

## 2019-10-11 NOTE — Discharge Instructions (Signed)
Call MD for T>100.4, heavy vaginal bleeding, severe abdominal pain, intractable nausea and/or vomiting, or respiratory distress.  Call office to schedule postpartum visit in 6 weeks.  Pelvic rest x 6 weeks.  No driving while taking narcotics.  Remove honeycomb dressing by 1 week postop.

## 2019-10-11 NOTE — Progress Notes (Signed)
Subjective: Postpartum Day 2: Cesarean Delivery Patient reports tolerating PO, + flatus and no problems voiding.    Objective: Vital signs in last 24 hours: Temp:  [97.6 F (36.4 C)-97.7 F (36.5 C)] 97.6 F (36.4 C) (03/24 0509) Pulse Rate:  [61-68] 68 (03/24 0509) Resp:  [17-20] 20 (03/24 0509) BP: (106-122)/(72-79) 122/79 (03/24 0509) SpO2:  [100 %] 100 % (03/24 0509)  Physical Exam:  General: alert, cooperative and appears stated age Lochia: appropriate Uterine Fundus: firm Incision: healing well, no significant drainage, no dehiscence DVT Evaluation: No evidence of DVT seen on physical exam. Negative Homan's sign. No cords or calf tenderness.  Recent Labs    10/09/19 0030 10/09/19 0606  HGB 11.8* 10.8*  HCT 36.4 32.9*    Assessment/Plan: Status post Cesarean section. Doing well postoperatively.  Discharge home with standard precautions and return to clinic in 4-6 weeks.  Linda Hedges 10/11/2019, 9:35 AM

## 2019-10-11 NOTE — Lactation Note (Signed)
This note was copied from a baby's chart. Lactation Consultation Note  Patient Name: [name] Date: 10/11/2019 Reason for consult: Follow-up assessment;Primapara;Term  1102-LC attempted to visit but mom preparing to get up to bathroom  1142-1150 F/U visit with P44 mom, baby is now 69 hrs old with 7%wt loss; mom awaiting discharge instructions.  LC entered room to find mom packing up the room and dressing infant who is fussy. Mom states she just finished feeding for about 20 minutes, states baby is now cluster feeding.   Mom seems confident with breastfeeding and denies any pain with latching other than slight tenderness with initial latch.  Reviewed alternating breasts and positions with feeding. Reviewed prevention of engorgement and treatment with ice instead of heat and reinforced removal of milk. Reviewed OP lactation services and phone numbers after discharge. Reviewed expected onset of lactogenesis s/p C/S and expected output from baby.  Interventions Interventions: Skin to skin;Breast massage;Hand express;Expressed milk;Coconut oil;Pre-pump if needed  Consult Status Consult Status: Complete    Cranston Neighbor 10/11/2019, 12:07 PM

## 2019-10-11 NOTE — Discharge Summary (Signed)
Obstetric Discharge Summary Reason for Admission: induction of labor and found to be breech on admission Prenatal Procedures: none Intrapartum Procedures: cesarean: low cervical, transverse Postpartum Procedures: none Complications-Operative and Postpartum: none Hemoglobin  Date Value Ref Range Status  10/09/2019 10.8 (L) 12.0 - 15.0 g/dL Final   HCT  Date Value Ref Range Status  10/09/2019 32.9 (L) 36.0 - 46.0 % Final    Physical Exam:  General: alert, cooperative and appears stated age 34: appropriate Uterine Fundus: firm Incision: healing well, no significant drainage, no dehiscence DVT Evaluation: No evidence of DVT seen on physical exam. Negative Homan's sign. No cords or calf tenderness.  Discharge Diagnoses: Term Pregnancy-delivered  Discharge Information: Date: 10/11/2019 Activity: pelvic rest Diet: routine Medications: PNV, Ibuprofen and oxicodone Condition: stable Instructions: refer to practice specific booklet Discharge to: home   Newborn Data: Live born female  Birth Weight: 8 lb 5 oz (3770 g) APGAR: 72, 10  Newborn Delivery   Birth date/time: 10/09/2019 02:43:00 Delivery type: C-Section, Low Transverse Trial of labor: No C-section categorization: Primary      Home with mother.  Linda Hedges 10/11/2019, 9:39 AM

## 2020-01-04 DIAGNOSIS — F321 Major depressive disorder, single episode, moderate: Secondary | ICD-10-CM | POA: Insufficient documentation

## 2021-01-14 ENCOUNTER — Other Ambulatory Visit: Payer: Self-pay

## 2021-01-14 ENCOUNTER — Encounter (HOSPITAL_BASED_OUTPATIENT_CLINIC_OR_DEPARTMENT_OTHER): Payer: Self-pay | Admitting: *Deleted

## 2021-01-14 ENCOUNTER — Emergency Department (HOSPITAL_BASED_OUTPATIENT_CLINIC_OR_DEPARTMENT_OTHER)
Admission: EM | Admit: 2021-01-14 | Discharge: 2021-01-15 | Disposition: A | Discharge status: Home / Self Care | Payer: BC Managed Care – PPO | Attending: Emergency Medicine | Admitting: Emergency Medicine

## 2021-01-14 DIAGNOSIS — R509 Fever, unspecified: Secondary | ICD-10-CM | POA: Insufficient documentation

## 2021-01-14 DIAGNOSIS — N3000 Acute cystitis without hematuria: Secondary | ICD-10-CM

## 2021-01-14 DIAGNOSIS — R1110 Vomiting, unspecified: Secondary | ICD-10-CM | POA: Insufficient documentation

## 2021-01-14 DIAGNOSIS — M791 Myalgia, unspecified site: Secondary | ICD-10-CM | POA: Diagnosis not present

## 2021-01-14 DIAGNOSIS — Z20822 Contact with and (suspected) exposure to covid-19: Secondary | ICD-10-CM | POA: Diagnosis not present

## 2021-01-14 DIAGNOSIS — R519 Headache, unspecified: Secondary | ICD-10-CM | POA: Insufficient documentation

## 2021-01-14 LAB — URINALYSIS, ROUTINE W REFLEX MICROSCOPIC
Bilirubin Urine: NEGATIVE
Glucose, UA: NEGATIVE mg/dL
Ketones, ur: 15 mg/dL — AB
Nitrite: NEGATIVE
Protein, ur: 100 mg/dL — AB
Specific Gravity, Urine: 1.01 (ref 1.005–1.030)
pH: 6 (ref 5.0–8.0)

## 2021-01-14 LAB — CBC WITH DIFFERENTIAL/PLATELET
Abs Immature Granulocytes: 0.04 10*3/uL (ref 0.00–0.07)
Basophils Absolute: 0 10*3/uL (ref 0.0–0.1)
Basophils Relative: 0 %
Eosinophils Absolute: 0 10*3/uL (ref 0.0–0.5)
Eosinophils Relative: 0 %
HCT: 39.2 % (ref 36.0–46.0)
Hemoglobin: 12.9 g/dL (ref 12.0–15.0)
Immature Granulocytes: 0 %
Lymphocytes Relative: 7 %
Lymphs Abs: 0.8 10*3/uL (ref 0.7–4.0)
MCH: 27.2 pg (ref 26.0–34.0)
MCHC: 32.9 g/dL (ref 30.0–36.0)
MCV: 82.5 fL (ref 80.0–100.0)
Monocytes Absolute: 0.7 10*3/uL (ref 0.1–1.0)
Monocytes Relative: 6 %
Neutro Abs: 10 10*3/uL — ABNORMAL HIGH (ref 1.7–7.7)
Neutrophils Relative %: 87 %
Platelets: 226 10*3/uL (ref 150–400)
RBC: 4.75 MIL/uL (ref 3.87–5.11)
RDW: 13.5 % (ref 11.5–15.5)
WBC: 11.6 10*3/uL — ABNORMAL HIGH (ref 4.0–10.5)
nRBC: 0 % (ref 0.0–0.2)

## 2021-01-14 LAB — RESP PANEL BY RT-PCR (FLU A&B, COVID) ARPGX2
Influenza A by PCR: NEGATIVE
Influenza B by PCR: NEGATIVE
SARS Coronavirus 2 by RT PCR: NEGATIVE

## 2021-01-14 LAB — BASIC METABOLIC PANEL
Anion gap: 9 (ref 5–15)
BUN: 13 mg/dL (ref 6–20)
CO2: 25 mmol/L (ref 22–32)
Calcium: 9.2 mg/dL (ref 8.9–10.3)
Chloride: 100 mmol/L (ref 98–111)
Creatinine, Ser: 0.79 mg/dL (ref 0.44–1.00)
GFR, Estimated: >60 mL/min (ref >60)
Glucose, Bld: 128 mg/dL — ABNORMAL HIGH (ref 70–99)
Potassium: 3.9 mmol/L (ref 3.5–5.1)
Sodium: 134 mmol/L — ABNORMAL LOW (ref 135–145)

## 2021-01-14 LAB — URINALYSIS, MICROSCOPIC (REFLEX): WBC, UA: >50 WBC/hpf (ref 0–5)

## 2021-01-14 LAB — PREGNANCY, URINE: Preg Test, Ur: NEGATIVE

## 2021-01-14 MED ORDER — CEPHALEXIN 500 MG PO CAPS: 500.0000 mg | ORAL_CAPSULE | Freq: Four times a day (QID) | ORAL | 0 refills | Status: DC

## 2021-01-14 MED ORDER — SODIUM CHLORIDE 0.9 % IV SOLN
1.0000 g | Freq: Once | INTRAVENOUS | Status: AC
  Administered 2021-01-14: 1 g via INTRAVENOUS
  Filled 2021-01-14: qty 10

## 2021-01-14 MED ORDER — ONDANSETRON 8 MG PO TBDP: 8.0000 mg | ORAL_TABLET | Freq: Three times a day (TID) | ORAL | 0 refills | Status: DC | PRN

## 2021-01-14 MED ORDER — ACETAMINOPHEN 325 MG PO TABS
650.0000 mg | ORAL_TABLET | Freq: Once | ORAL | Status: AC
  Administered 2021-01-14: 650 mg via ORAL
  Filled 2021-01-14: qty 2

## 2021-01-14 MED ORDER — SODIUM CHLORIDE 0.9 % IV BOLUS
1000.0000 mL | Freq: Once | INTRAVENOUS | Status: AC
  Administered 2021-01-14: 1000 mL via INTRAVENOUS

## 2021-01-14 NOTE — ED Triage Notes (Addendum)
Vomiting x 2 days. No pain. Fever 103. Covid test was negative. She feels she has the flu.

## 2021-01-14 NOTE — ED Notes (Addendum)
Pt. Treated for UTI Feb 2022.

## 2021-01-15 NOTE — ED Provider Notes (Signed)
[facility]GENCY DEPARTMENT Provider Note   CSN: [id number] Arrival date & time: 01/14/21  1918     History Chief Complaint  Patient presents with   Emesis    [name] is a 35 y.o. female.  The history is provided by the patient.  Emesis Severity:  Moderate Duration:  2 days Timing:  Intermittent Progression:  Worsening Chronicity:  New Relieved by:  Nothing Worsened by:  Nothing Associated symptoms: chills, fever, headaches and myalgias   Associated symptoms: no abdominal pain, no cough and no diarrhea   Patient is an otherwise healthy 35 year old who presents with flulike symptoms.  She reports for the past 2 days she has had vomiting, headaches, fevers and chills and myalgias.  No cough or shortness of breath.  She also reports dysuria approximately 2 days ago.  No vaginal bleeding or discharge.  No significant abdominal or back pain.     Past Medical History:  Diagnosis Date   Anemia    Celiac disease     Patient Active Problem List   Diagnosis Date Noted   Term pregnancy 10/09/2019    Past Surgical History:  Procedure Laterality Date   BOWEL RESECTION     as child   CESAREAN SECTION N/A 10/09/2019   Procedure: CESAREAN SECTION;  Surgeon: Tyson Dense, MD;  [facility]: [facility] LD [facility]S;  Service: Obstetrics;  Laterality: N/A;   NO PAST SURGERIES     WISDOM TOOTH EXTRACTION       OB History     Gravida  1   Para  1   Term  1   Preterm      AB      Living  1      SAB      IAB      Ectopic      Multiple  0   Live Births  1           Family History  Problem Relation Age of Onset   Cancer Mother        cervical    Social History   Tobacco Use   Smoking status: Never   Smokeless tobacco: Never  Vaping Use   Vaping Use: Never used  Substance Use Topics   Alcohol use: Never   Drug use: Never    Home Medications Prior to Admission medications   Medication Sig Start Date End Date Taking?  Authorizing Provider  buPROPion (WELLBUTRIN) 75 MG tablet bupropion HCl 75 mg tablet 11/13/20  Yes [provider]  cephALEXin (KEFLEX) 500 MG capsule Take 1 capsule (500 mg total) by mouth 4 (four) times daily. 01/14/21  Yes Ripley Fraise, MD  ondansetron (ZOFRAN ODT) 8 MG disintegrating tablet Take 1 tablet (8 mg total) by mouth every 8 (eight) hours as needed. 01/14/21  Yes Ripley Fraise, MD  acetaminophen (TYLENOL) 500 MG tablet Take 2 tablets (1,000 mg total) by mouth every 6 (six) hours. 10/11/19   Morris, Jinny Blossom, DO  Azelaic Acid 15 % cream Apply 1 application topically daily.  09/13/19   [provider]  ibuprofen (ADVIL) 800 MG tablet Take 1 tablet (800 mg total) by mouth every 6 (six) hours. 10/11/19   Morris, Jinny Blossom, DO  oxyCODONE (OXY IR/ROXICODONE) 5 MG immediate release tablet Take 1 tablet (5 mg total) by mouth every 4 (four) hours as needed for moderate pain. 10/11/19   Linda Hedges, DO  Prenatal Vit-Fe Fumarate-FA (PRENATAL MULTIVITAMIN) TABS tablet Take 1 tablet by mouth daily  at 12 noon.    [provider]    Allergies    Patient has no known allergies.  Review of Systems   Review of Systems  Constitutional:  Positive for chills and fever.  Respiratory:  Negative for cough.   Gastrointestinal:  Positive for vomiting. Negative for abdominal pain and diarrhea.  Genitourinary:  Positive for dysuria. Negative for vaginal bleeding and vaginal discharge.  Musculoskeletal:  Positive for myalgias.  Skin:  Negative for rash.  Neurological:  Positive for headaches.  All other systems reviewed and are negative.  Physical Exam Updated Vital Signs BP 113/71   Pulse 80   Temp 98.6 F (37 C) (Oral)   Resp 18   Ht 1.575 m (5' 2" )   Wt 54.5 kg   LMP 12/31/2020   SpO2 96%   BMI 21.98 kg/m   Physical Exam CONSTITUTIONAL: Well developed/well nourished HEAD: Normocephalic/atraumatic EYES: EOMI/PERRL NECK: supple no meningeal signs SPINE/BACK:entire  spine nontender CV: S1/S2 noted, no murmurs/rubs/gallops noted LUNGS: Lungs are clear to auscultation bilaterally, no apparent distress ABDOMEN: soft, nontender, no rebound or guarding, bowel sounds noted throughout abdomen GU:no cva tenderness NEURO: Pt is awake/alert/appropriate, moves all extremitiesx4.  No facial droop.   EXTREMITIES: pulses normal/equal, full ROM SKIN: warm, color normal, no rash PSYCH: no abnormalities of mood noted, alert and oriented to situation  ED Results / Procedures / Treatments   Labs (all labs ordered are listed, but only abnormal results are displayed) Labs Reviewed  URINALYSIS, ROUTINE W REFLEX MICROSCOPIC - Abnormal; Notable for the following components:      Result Value   APPearance CLOUDY (*)    Hgb urine dipstick SMALL (*)    Ketones, ur 15 (*)    Protein, ur 100 (*)    Leukocytes,Ua LARGE (*)    All other components within normal limits  URINALYSIS, MICROSCOPIC (REFLEX) - Abnormal; Notable for the following components:   Bacteria, UA MANY (*)    All other components within normal limits  BASIC METABOLIC PANEL - Abnormal; Notable for the following components:   Sodium 134 (*)    Glucose, Bld 128 (*)    All other components within normal limits  CBC WITH DIFFERENTIAL/PLATELET - Abnormal; Notable for the following components:   WBC 11.6 (*)    Neutro Abs 10.0 (*)    All other components within normal limits  RESP PANEL BY RT-PCR (FLU A&B, COVID) ARPGX2  URINE CULTURE  PREGNANCY, URINE    EKG None  Radiology No results found.  Procedures Procedures   Medications Ordered in ED Medications  acetaminophen (TYLENOL) tablet 650 mg (650 mg Oral Given 01/14/21 1930)  cefTRIAXone (ROCEPHIN) 1 g in sodium chloride 0.9 % 100 mL IVPB (0 g Intravenous Stopped 01/15/21 0007)  sodium chloride 0.9 % bolus 1,000 mL (0 mLs Intravenous Stopped 01/15/21 0027)    ED Course  I have reviewed the triage vital signs and the nursing notes.  Pertinent  labs  results that were available during my care of the patient were reviewed by me and considered in my medical decision making (see chart for details).    MDM Rules/Calculators/A&P                          Patient presents with reported flulike illness for the past 2 days with fever, vomiting and myalgias.  Urine is consistent with infectious etiology.  Likely early pyelo given fever and body aches. Patient is well-appearing.  No acute distress.  Other labs are reassuring. Denies any abdominal or back pain, defer imaging at this time Patient is safe for discharge home Final Clinical Impression(s) / ED Diagnoses Final diagnoses:  None    Rx / DC Orders ED Discharge Orders          Ordered    cephALEXin (KEFLEX) 500 MG capsule  4 times daily        01/14/21 2317    ondansetron (ZOFRAN ODT) 8 MG disintegrating tablet  Every 8 hours PRN        01/14/21 2317             Ripley Fraise, MD 01/15/21 [phone]

## 2021-01-17 LAB — URINE CULTURE: Culture: >=100000 — AB

## 2021-01-18 ENCOUNTER — Telehealth: Payer: Self-pay | Admitting: Emergency Medicine

## 2021-01-18 NOTE — Telephone Encounter (Signed)
Post ED Visit - Positive Culture Follow-up  Culture report reviewed by antimicrobial stewardship pharmacist: [place] Team []  Elenor Quinones, Pharm.D. []  Heide Guile, Pharm.D., BCPS AQ-ID []  Parks Neptune, Pharm.D., BCPS []  Alycia Rossetti, Pharm.D., BCPS []  [place], [place].D., BCPS, AAHIVP []  Legrand Como, Pharm.D., BCPS, AAHIVP []  Salome Arnt, PharmD, BCPS []  Johnnette Gourd, PharmD, BCPS []  Hughes Better, PharmD, BCPS [x]  Lorelei Pont, PharmD []  Laqueta Linden, PharmD, BCPS []  Albertina Parr, PharmD  [place] Team []  Leodis Sias, PharmD []  Lindell Spar, PharmD []  Royetta Asal, PharmD []  Graylin Shiver, Rph []  Rema Fendt) Glennon Mac, PharmD []  Arlyn Dunning, PharmD []  Netta Cedars, PharmD []  Dia Sitter, PharmD []  Leone Haven, PharmD []  Gretta Arab, PharmD []  Theodis Shove, PharmD []  Peggyann Juba, PharmD []  Reuel Boom, PharmD   Positive urine culture Treated with Cephalexin, organism sensitive to the same and no further patient follow-up is required at this time.  Milus Mallick 01/18/2021, 4:48 PM

## 2021-05-11 ENCOUNTER — Encounter (HOSPITAL_BASED_OUTPATIENT_CLINIC_OR_DEPARTMENT_OTHER): Payer: Self-pay | Admitting: Obstetrics and Gynecology

## 2021-05-11 ENCOUNTER — Emergency Department (HOSPITAL_BASED_OUTPATIENT_CLINIC_OR_DEPARTMENT_OTHER)
Admission: EM | Admit: 2021-05-11 | Discharge: 2021-05-11 | Disposition: A | Discharge status: Home / Self Care | Payer: BC Managed Care – PPO | Attending: Emergency Medicine | Admitting: Emergency Medicine

## 2021-05-11 ENCOUNTER — Other Ambulatory Visit: Payer: Self-pay

## 2021-05-11 ENCOUNTER — Emergency Department (HOSPITAL_BASED_OUTPATIENT_CLINIC_OR_DEPARTMENT_OTHER): Payer: BC Managed Care – PPO

## 2021-05-11 DIAGNOSIS — R1011 Right upper quadrant pain: Secondary | ICD-10-CM | POA: Insufficient documentation

## 2021-05-11 DIAGNOSIS — R197 Diarrhea, unspecified: Secondary | ICD-10-CM | POA: Diagnosis not present

## 2021-05-11 DIAGNOSIS — D649 Anemia, unspecified: Secondary | ICD-10-CM | POA: Diagnosis not present

## 2021-05-11 DIAGNOSIS — R110 Nausea: Secondary | ICD-10-CM | POA: Insufficient documentation

## 2021-05-11 HISTORY — DX: Other specified disorders of adrenal gland: E27.8

## 2021-05-11 LAB — COMPREHENSIVE METABOLIC PANEL
ALT: 9 U/L (ref 0–44)
AST: 12 U/L — ABNORMAL LOW (ref 15–41)
Albumin: 4.6 g/dL (ref 3.5–5.0)
Alkaline Phosphatase: 39 U/L (ref 38–126)
Anion gap: 7 (ref 5–15)
BUN: 16 mg/dL (ref 6–20)
CO2: 28 mmol/L (ref 22–32)
Calcium: 9.5 mg/dL (ref 8.9–10.3)
Chloride: 104 mmol/L (ref 98–111)
Creatinine, Ser: 0.68 mg/dL (ref 0.44–1.00)
GFR, Estimated: >60 mL/min (ref >60)
Glucose, Bld: 85 mg/dL (ref 70–99)
Potassium: 3.7 mmol/L (ref 3.5–5.1)
Sodium: 139 mmol/L (ref 135–145)
Total Bilirubin: 0.3 mg/dL (ref 0.3–1.2)
Total Protein: 7.2 g/dL (ref 6.5–8.1)

## 2021-05-11 LAB — CBC WITH DIFFERENTIAL/PLATELET
Abs Immature Granulocytes: 0.01 10*3/uL (ref 0.00–0.07)
Basophils Absolute: 0 10*3/uL (ref 0.0–0.1)
Basophils Relative: 1 %
Eosinophils Absolute: 0.1 10*3/uL (ref 0.0–0.5)
Eosinophils Relative: 1 %
HCT: 37 % (ref 36.0–46.0)
Hemoglobin: 11.5 g/dL — ABNORMAL LOW (ref 12.0–15.0)
Immature Granulocytes: 0 %
Lymphocytes Relative: 31 %
Lymphs Abs: 1.3 10*3/uL (ref 0.7–4.0)
MCH: 24.8 pg — ABNORMAL LOW (ref 26.0–34.0)
MCHC: 31.1 g/dL (ref 30.0–36.0)
MCV: 79.7 fL — ABNORMAL LOW (ref 80.0–100.0)
Monocytes Absolute: 0.3 10*3/uL (ref 0.1–1.0)
Monocytes Relative: 8 %
Neutro Abs: 2.6 10*3/uL (ref 1.7–7.7)
Neutrophils Relative %: 59 %
Platelets: 380 10*3/uL (ref 150–400)
RBC: 4.64 MIL/uL (ref 3.87–5.11)
RDW: 13.6 % (ref 11.5–15.5)
WBC: 4.3 10*3/uL (ref 4.0–10.5)
nRBC: 0 % (ref 0.0–0.2)

## 2021-05-11 LAB — URINALYSIS, ROUTINE W REFLEX MICROSCOPIC
Bilirubin Urine: NEGATIVE
Glucose, UA: NEGATIVE mg/dL
Ketones, ur: NEGATIVE mg/dL
Leukocytes,Ua: NEGATIVE
Nitrite: NEGATIVE
Protein, ur: NEGATIVE mg/dL
Specific Gravity, Urine: 1.005 (ref 1.005–1.030)
pH: 6 (ref 5.0–8.0)

## 2021-05-11 LAB — LIPASE, BLOOD: Lipase: 42 U/L (ref 11–51)

## 2021-05-11 LAB — PREGNANCY, URINE: Preg Test, Ur: NEGATIVE

## 2021-05-11 MED ORDER — ONDANSETRON HCL 4 MG/2ML IJ SOLN
4.0000 mg | Freq: Once | INTRAMUSCULAR | Status: AC
  Administered 2021-05-11: 4 mg via INTRAVENOUS
  Filled 2021-05-11: qty 2

## 2021-05-11 MED ORDER — SODIUM CHLORIDE 0.9 % IV BOLUS
1000.0000 mL | Freq: Once | INTRAVENOUS | Status: AC
Start: 2021-05-11 — End: 2021-05-11
  Administered 2021-05-11: 1000 mL via INTRAVENOUS

## 2021-05-11 MED ORDER — IOHEXOL 300 MG/ML  SOLN
80.0000 mL | Freq: Once | INTRAMUSCULAR | Status: AC | PRN
  Administered 2021-05-11: 80 mL via INTRAVENOUS

## 2021-05-11 MED ORDER — ONDANSETRON 4 MG PO TBDP: 4.0000 mg | ORAL_TABLET | Freq: Three times a day (TID) | ORAL | 0 refills | Status: DC | PRN

## 2021-05-11 NOTE — ED Provider Notes (Signed)
[facility] EMERGENCY DEPT Provider Note   CSN: [id number] Arrival date & time: 05/11/21  1722     History Chief Complaint  Patient presents with   Abdominal Pain    [name] is a 35 y.o. female.  HPI  Patient with history of celiac disease and an adrenal mass presents with right upper quadrant pain.  The pain is intermittent, feels like a cramping pain.  Is worse after she eats meals, unable to identify any other aggravating factor.  Says it occurs randomly, does not appear to be triggered by certain types of food.  It is associated with nausea, no vomiting.  Patient is also having intermittent diarrhea.  She has been followed by GI and has a HIDA scan scheduled for later this week, but she is concerned that the pain recently became worse and in the frequency and severity is increasing prompting the visit today.  Past Medical History:  Diagnosis Date   Adrenal mass ([place])    Anemia    Celiac disease     Patient Active Problem List   Diagnosis Date Noted   Term pregnancy 10/09/2019    Past Surgical History:  Procedure Laterality Date   BOWEL RESECTION     as child   CESAREAN SECTION N/A 10/09/2019   Procedure: CESAREAN SECTION;  Surgeon: Tyson Dense, MD;  Location: [facility] LD ORS;  Service: Obstetrics;  Laterality: N/A;   NO PAST SURGERIES     WISDOM TOOTH EXTRACTION       OB History     Gravida  1   Para  1   Term  1   Preterm  0   AB  0   Living  1      SAB  0   IAB  0   Ectopic  0   Multiple  0   Live Births  1           Family History  Problem Relation Age of Onset   Cancer Mother        cervical    Social History   Tobacco Use   Smoking status: Never    Passive exposure: Never   Smokeless tobacco: Never  Vaping Use   Vaping Use: Never used  Substance Use Topics   Alcohol use: Never   Drug use: Never    Home Medications Prior to Admission medications   Medication Sig Start Date End Date  Taking? Authorizing Provider  acetaminophen (TYLENOL) 500 MG tablet Take 2 tablets (1,000 mg total) by mouth every 6 (six) hours. 10/11/19   Morris, Jinny Blossom, DO  Azelaic Acid 15 % cream Apply 1 application topically daily.  09/13/19   [provider]  buPROPion (WELLBUTRIN) 75 MG tablet bupropion HCl 75 mg tablet 11/13/20   [provider]  cephALEXin (KEFLEX) 500 MG capsule Take 1 capsule (500 mg total) by mouth 4 (four) times daily. 01/14/21   Ripley Fraise, MD  ibuprofen (ADVIL) 800 MG tablet Take 1 tablet (800 mg total) by mouth every 6 (six) hours. 10/11/19   Morris, Megan, DO  ondansetron (ZOFRAN ODT) 8 MG disintegrating tablet Take 1 tablet (8 mg total) by mouth every 8 (eight) hours as needed. 01/14/21   Ripley Fraise, MD  oxyCODONE (OXY IR/ROXICODONE) 5 MG immediate release tablet Take 1 tablet (5 mg total) by mouth every 4 (four) hours as needed for moderate pain. 10/11/19   Linda Hedges, DO  Prenatal Vit-Fe Fumarate-FA (PRENATAL MULTIVITAMIN) TABS tablet Take 1  tablet by mouth daily at 12 noon.    [provider]    Allergies    Patient has no known allergies.  Review of Systems   Review of Systems  Constitutional:  Negative for appetite change, chills, fatigue and fever.  HENT:  Negative for ear pain and sore throat.   Eyes:  Negative for pain and visual disturbance.  Respiratory:  Negative for cough and shortness of breath.   Cardiovascular:  Negative for chest pain and palpitations.  Gastrointestinal:  Positive for abdominal pain and nausea. Negative for vomiting.  Genitourinary:  Negative for dysuria, hematuria and vaginal discharge.  Musculoskeletal:  Negative for arthralgias and back pain.  Skin:  Negative for color change and rash.  Neurological:  Negative for seizures and syncope.  All other systems reviewed and are negative.  Physical Exam Updated Vital Signs BP (!) 144/95   Pulse 75   Temp 97.7 F (36.5 C) (Tympanic)   Resp 18   Ht 5'  2" (1.575 m)   Wt 54.4 kg   LMP 05/10/2021 (Exact Date)   SpO2 100%   Breastfeeding No   BMI 21.95 kg/m   Physical Exam Vitals and nursing note reviewed.  Constitutional:      General: She is not in acute distress.    Appearance: She is well-developed.  HENT:     Head: Normocephalic and atraumatic.  Eyes:     Conjunctiva/sclera: Conjunctivae normal.  Cardiovascular:     Rate and Rhythm: Normal rate and regular rhythm.     Heart sounds: No murmur heard. Pulmonary:     Effort: Pulmonary effort is normal. No respiratory distress.     Breath sounds: Normal breath sounds.  Abdominal:     Palpations: Abdomen is soft.     Tenderness: There is abdominal tenderness in the right upper quadrant. There is no right CVA tenderness or guarding.  Musculoskeletal:     Cervical back: Neck supple.  Skin:    General: Skin is warm and dry.  Neurological:     Mental Status: She is alert.    ED Results / Procedures / Treatments   Labs (all labs ordered are listed, but only abnormal results are displayed) Labs Reviewed  CBC WITH DIFFERENTIAL/PLATELET  COMPREHENSIVE METABOLIC PANEL  LIPASE, BLOOD  URINALYSIS, ROUTINE W REFLEX MICROSCOPIC  PREGNANCY, URINE    EKG None  Radiology No results found.  Procedures Procedures   Medications Ordered in ED Medications - No data to display  ED Course  I have reviewed the triage vital signs and the nursing notes.  Pertinent labs & imaging results that were available during my care of the patient were reviewed by me and considered in my medical decision making (see chart for details).  Clinical Course as of 05/11/21 2012  Sun May 11, 2021  1913 Lipase, blood Lipase is not 3 times greater than the normal limit, no epigastric pain with radiation to the back, there is nausea but no vomiting.  Picture is not consistent with pancreatitis. [HS]  1914 Comprehensive metabolic panel(!) No gross electrolyte derangement, no elevated LFT pattern that  be concerning for biliary obstruction.  No AKI [HS]  1914 CBC with Differential(!) Patient is mildly anemic at 11.5 but not consistent with an acute bleed.  She is without leukocytosis. [HS]  1936 Urinalysis, Routine w reflex microscopic Urine, Clean Catch(!) No UTI, not Pyelo [HS]    Clinical Course User Index [HS] Sherrill Raring, PA-C   MDM Rules/Calculators/A&P  Please see ED course for interpretation of labs as they pertain to the differential.  CT scan is unremarkable for any acute process, patient has a renal mass which she is aware of.  Repeat ultrasound recommended in January which patient is aware of.  Patient has follow-up tomorrow with her GI doctor to get a HIDA scan.  Her vitals are stable, she is nontoxic-appearing and the nausea has resolved.  Serial abdominal exams throughout today have been benign.  Given that this has been ongoing for the last 6 months, I think patient is stable for discharge and outpatient follow-up.   Final Clinical Impression(s) / ED Diagnoses Final diagnoses:  None    Rx / DC Orders ED Discharge Orders     None        Sherrill Raring, Hershal Coria 05/11/21 2013    Gareth Morgan, MD 05/12/21 1836

## 2021-05-11 NOTE — Discharge Instructions (Signed)
Take Zofran every 8 hours as needed for nausea and vomiting. Repeat ultrasound in January of next year. Follow-up with your GI doctor as planned tomorrow for the HIDA scan. Return to the ED if things change or worsen.

## 2021-05-11 NOTE — ED Triage Notes (Signed)
Patient reports to the ER for RUQ pain. Patient states she has a mass on her kidney and was having concerns that it is getting worse. Patient reports frequent nausea with the pain.

## 2021-05-13 ENCOUNTER — Other Ambulatory Visit (HOSPITAL_BASED_OUTPATIENT_CLINIC_OR_DEPARTMENT_OTHER): Payer: Self-pay

## 2021-05-13 MED ORDER — INFLUENZA VAC SPLIT QUAD 0.5 ML IM SUSY
PREFILLED_SYRINGE | INTRAMUSCULAR | 0 refills | Status: DC
  Filled 2021-05-13: qty 0.5, 1d supply, fill #0

## 2021-10-13 ENCOUNTER — Other Ambulatory Visit: Payer: Self-pay | Admitting: Urology

## 2021-10-13 DIAGNOSIS — N2889 Other specified disorders of kidney and ureter: Secondary | ICD-10-CM

## 2021-10-17 ENCOUNTER — Other Ambulatory Visit (HOSPITAL_BASED_OUTPATIENT_CLINIC_OR_DEPARTMENT_OTHER): Payer: Self-pay

## 2021-10-17 ENCOUNTER — Other Ambulatory Visit: Payer: Self-pay | Admitting: Urology

## 2021-10-17 MED ORDER — KETOROLAC TROMETHAMINE 10 MG PO TABS
ORAL_TABLET | ORAL | 0 refills | Status: DC
  Filled 2021-10-17: qty 20, 5d supply, fill #0

## 2021-10-17 MED ORDER — ONDANSETRON 4 MG PO TBDP
ORAL_TABLET | ORAL | 1 refills | Status: DC
  Filled 2021-10-17: qty 21, 23d supply, fill #0

## 2021-11-05 ENCOUNTER — Ambulatory Visit
Admission: RE | Admit: 2021-11-05 | Discharge: 2021-11-05 | Disposition: A | Discharge status: Home / Self Care | Payer: BC Managed Care – PPO | Source: Ambulatory Visit | Attending: Urology | Admitting: Urology

## 2021-11-05 DIAGNOSIS — N2889 Other specified disorders of kidney and ureter: Secondary | ICD-10-CM

## 2021-11-05 MED ORDER — GADOBENATE DIMEGLUMINE 529 MG/ML IV SOLN
11.0000 mL | Freq: Once | INTRAVENOUS | Status: AC | PRN
  Administered 2021-11-05: 11 mL via INTRAVENOUS

## 2021-12-01 NOTE — Patient Instructions (Signed)
DUE TO COVID-19 ONLY TWO VISITORS  (aged 36 and older)  ARE ALLOWED TO COME WITH YOU AND STAY IN THE WAITING ROOM ONLY DURING PRE OP AND PROCEDURE.   ?**NO VISITORS ARE ALLOWED IN THE SHORT STAY AREA OR RECOVERY ROOM!!** ? ?IF YOU WILL BE ADMITTED INTO THE HOSPITAL YOU ARE ALLOWED ONLY FOUR SUPPORT PEOPLE DURING VISITATION HOURS ONLY (7 AM -8PM)   ?The support person(s) must pass our screening, gel in and out, and wear a mask at all times, including in the patient?s room. ?Patients must also wear a mask when staff or their support person are in the room. ?Visitors GUEST BADGE MUST BE WORN VISIBLY  ?One adult visitor may remain with you overnight and MUST be in the room by 8 P.M. ?  ? ? Your procedure is scheduled on: 12/16/21 ? ? Report to [facility] Main Entrance ? ?  Report to Short stay at : 5:15 AM ? ? Call this number if you have problems the morning of surgery [phone] ? ?  ? ? Clear liquids starting the day before surgery until : 4:15 AM DAY OF SURGERY ? ?Water ?Black Coffee (sugar ok, NO MILK/CREAM OR CREAMERS)  ?Tea (sugar ok, NO MILK/CREAM OR CREAMERS) regular and decaf                             ?Plain Jell-O (NO RED)                                           ?Fruit ices (not with fruit pulp, NO RED)                                     ?Popsicles (NO RED)                                                                  ?Juice: apple, WHITE grape, WHITE cranberry ?Sports drinks like Gatorade (NO RED) ?Clear broth(vegetable,chicken,beef) ? ?FOLLOW BOWEL PREP AND ANY ADDITIONAL PRE OP INSTRUCTIONS YOU RECEIVED FROM YOUR SURGEON'S OFFICE!!! ?  ?Oral Hygiene is also important to reduce your risk of infection.                                    ?Remember - BRUSH YOUR TEETH THE MORNING OF SURGERY WITH YOUR REGULAR TOOTHPASTE ? ? Do NOT smoke after Midnight ? ? Take these medicines the morning of surgery with A SIP OF WATER: bupropion.Tylenol as needed. ? ?DO NOT TAKE ANY ORAL DIABETIC MEDICATIONS  DAY OF YOUR SURGERY ? ?Bring CPAP mask and tubing day of surgery. ?                  ?           You may not have any metal on your body including hair pins, jewelry, and body piercing ? ?           Do not wear  make-up, lotions, powders, perfumes/cologne, or deodorant ? ?Do not wear nail polish including gel and S&S, artificial/acrylic nails, or any other type of covering on natural nails including finger and toenails. If you have artificial nails, gel coating, etc. that needs to be removed by a nail salon please have this removed prior to surgery or surgery may need to be canceled/ delayed if the surgeon/ anesthesia feels like they are unable to be safely monitored.  ? ?Do not shave  48 hours prior to surgery.  ? ? Do not bring valuables to the hospital. [facility] NOT ?            RESPONSIBLE   FOR VALUABLES. ? ? Contacts, dentures or bridgework may not be worn into surgery. ? ? Bring small overnight bag day of surgery. ?  ? Patients discharged on the day of surgery will not be allowed to drive home.  Someone NEEDS to stay with you for the first 24 hours after anesthesia. ? ? Special Instructions: Bring a copy of your healthcare power of attorney and living will documents         the day of surgery if you haven't scanned them before. ? ?            Please read over the following fact sheets you were given: IF [place] [phone] ? ?    - Preparing for Surgery ?Before surgery, you can play an important role.  Because skin is not sterile, your skin needs to be as free of germs as possible.  You can reduce the number of germs on your skin by washing with CHG (chlorahexidine gluconate) soap before surgery.  CHG is an antiseptic cleaner which kills germs and bonds with the skin to continue killing germs even after washing. ?Please DO NOT use if you have an allergy to CHG or antibacterial soaps.  If your skin becomes reddened/irritated stop using the  CHG and inform your nurse when you arrive at Short Stay. ?Do not shave (including legs and underarms) for at least 48 hours prior to the first CHG shower.  You may shave your face/neck. ?Please follow these instructions carefully: ? 1.  Shower with CHG Soap the night before surgery and the  morning of Surgery. ? 2.  If you choose to wash your hair, wash your hair first as usual with your  normal  shampoo. ? 3.  After you shampoo, rinse your hair and body thoroughly to remove the  shampoo.                           4.  Use CHG as you would any other liquid soap.  You can apply chg directly  to the skin and wash  ?                     Gently with a scrungie or clean washcloth. ? 5.  Apply the CHG Soap to your body ONLY FROM THE NECK DOWN.   Do not use on face/ open      ?                     Wound or open sores. Avoid contact with eyes, ears mouth and genitals (private parts).  ?                     Wash face,  Genitals (private parts) with your normal soap. ?            6.  Wash thoroughly, paying special attention to the area where your surgery  will be performed. ? 7.  Thoroughly rinse your body with warm water from the neck down. ? 8.  DO NOT shower/wash with your normal soap after using and rinsing off  the CHG Soap. ?               9.  Pat yourself dry with a clean towel. ?           10.  Wear clean pajamas. ?           11.  Place clean sheets on your bed the night of your first shower and do not  sleep with pets. ?Day of Surgery : ?Do not apply any lotions/deodorants the morning of surgery.  Please wear clean clothes to the hospital/surgery center. ? ?FAILURE TO FOLLOW THESE INSTRUCTIONS MAY RESULT IN THE CANCELLATION OF YOUR SURGERY ?PATIENT SIGNATURE_________________________________ ? ?NURSE SIGNATURE__________________________________ ? ?________________________________________________________________________  ? ?Incentive Spirometer ? ?An incentive spirometer is a tool that can help keep your lungs clear and  active. This tool measures how well you are filling your lungs with each breath. Taking long deep breaths may help reverse or decrease the chance of developing breathing (pulmonary) problems (especially infection) following: ?A long period of time when you are unable to move or be active. ?BEFORE THE PROCEDURE  ?If the spirometer includes an indicator to show your best effort, your nurse or respiratory therapist will set it to a desired goal. ?If possible, sit up straight or lean slightly forward. Try not to slouch. ?Hold the incentive spirometer in an upright position. ?INSTRUCTIONS FOR USE  ?Sit on the edge of your bed if possible, or sit up as far as you can in bed or on a chair. ?Hold the incentive spirometer in an upright position. ?Breathe out normally. ?Place the mouthpiece in your mouth and seal your lips tightly around it. ?Breathe in slowly and as deeply as possible, raising the piston or the ball toward the top of the column. ?Hold your breath for 3-5 seconds or for as long as possible. Allow the piston or ball to fall to the bottom of the column. ?Remove the mouthpiece from your mouth and breathe out normally. ?Rest for a few seconds and repeat Steps 1 through 7 at least 10 times every 1-2 hours when you are awake. Take your time and take a few normal breaths between deep breaths. ?The spirometer may include an indicator to show your best effort. Use the indicator as a goal to work toward during each repetition. ?After each set of 10 deep breaths, practice coughing to be sure your lungs are clear. If you have an incision (the cut made at the time of surgery), support your incision when coughing by placing a pillow or rolled up towels firmly against it. ?Once you are able to get out of bed, walk around indoors and cough well. You may stop using the incentive spirometer when instructed by your caregiver.  ?RISKS AND COMPLICATIONS ?Take your time so you do not get dizzy or light-headed. ?If you are in pain,  you may need to take or ask for pain medication before doing incentive spirometry. It is harder to take a deep breath if you are having pain. ?AFTER USE ?Rest and breathe slowly and easily. ?It can b

## 2021-12-03 ENCOUNTER — Encounter (HOSPITAL_COMMUNITY)
Admission: RE | Admit: 2021-12-03 | Discharge: 2021-12-03 | Disposition: A | Discharge status: Home / Self Care | Payer: BC Managed Care – PPO | Source: Ambulatory Visit | Attending: Anesthesiology | Admitting: Anesthesiology

## 2021-12-03 ENCOUNTER — Encounter (HOSPITAL_COMMUNITY): Payer: Self-pay

## 2021-12-16 ENCOUNTER — Ambulatory Visit: Admit: 2021-12-16 | Payer: BC Managed Care – PPO | Admitting: Urology

## 2021-12-16 SURGERY — NEPHRECTOMY, PARTIAL, ROBOT-ASSISTED
Anesthesia: General | Laterality: Right

## 2022-01-09 ENCOUNTER — Other Ambulatory Visit (HOSPITAL_BASED_OUTPATIENT_CLINIC_OR_DEPARTMENT_OTHER): Payer: Self-pay

## 2022-01-09 ENCOUNTER — Encounter (HOSPITAL_BASED_OUTPATIENT_CLINIC_OR_DEPARTMENT_OTHER): Payer: Self-pay

## 2022-01-09 MED ORDER — KETOROLAC TROMETHAMINE 10 MG PO TABS
10.0000 mg | ORAL_TABLET | Freq: Four times a day (QID) | ORAL | 1 refills | Status: DC | PRN
  Filled 2022-01-09: qty 20, 5d supply, fill #0

## 2022-01-19 ENCOUNTER — Other Ambulatory Visit (HOSPITAL_BASED_OUTPATIENT_CLINIC_OR_DEPARTMENT_OTHER): Payer: Self-pay

## 2022-03-30 ENCOUNTER — Encounter (HOSPITAL_BASED_OUTPATIENT_CLINIC_OR_DEPARTMENT_OTHER): Payer: Self-pay | Admitting: *Deleted

## 2022-03-30 ENCOUNTER — Ambulatory Visit (INDEPENDENT_AMBULATORY_CARE_PROVIDER_SITE_OTHER): Payer: BC Managed Care – PPO | Admitting: *Deleted

## 2022-03-30 DIAGNOSIS — Z3201 Encounter for pregnancy test, result positive: Secondary | ICD-10-CM

## 2022-03-30 DIAGNOSIS — N926 Irregular menstruation, unspecified: Secondary | ICD-10-CM

## 2022-03-30 LAB — POCT URINE PREGNANCY: Preg Test, Ur: POSITIVE — AB

## 2022-03-30 NOTE — Progress Notes (Signed)
Pt here for confirmation of pregnancy. Test positive with faint line. Hcg level ordered. New OB appt provided.

## 2022-03-31 LAB — BETA HCG QUANT (REF LAB): hCG Quant: 666 m[IU]/mL

## 2022-04-01 ENCOUNTER — Other Ambulatory Visit (HOSPITAL_BASED_OUTPATIENT_CLINIC_OR_DEPARTMENT_OTHER): Payer: BC Managed Care – PPO

## 2022-04-01 DIAGNOSIS — N926 Irregular menstruation, unspecified: Secondary | ICD-10-CM

## 2022-04-02 LAB — BETA HCG QUANT (REF LAB): hCG Quant: 1579 m[IU]/mL

## 2022-04-03 ENCOUNTER — Other Ambulatory Visit (HOSPITAL_BASED_OUTPATIENT_CLINIC_OR_DEPARTMENT_OTHER): Payer: Self-pay

## 2022-04-03 ENCOUNTER — Other Ambulatory Visit (HOSPITAL_BASED_OUTPATIENT_CLINIC_OR_DEPARTMENT_OTHER): Payer: Self-pay | Admitting: Obstetrics & Gynecology

## 2022-04-03 MED ORDER — PROGESTERONE 200 MG PO CAPS
200.0000 mg | ORAL_CAPSULE | Freq: Every day | ORAL | 1 refills | Status: DC
  Filled 2022-04-03: qty 30, 30d supply, fill #0

## 2022-04-17 ENCOUNTER — Ambulatory Visit
Admission: EM | Admit: 2022-04-17 | Discharge: 2022-04-17 | Disposition: A | Discharge status: Home / Self Care | Payer: BC Managed Care – PPO | Attending: Physician Assistant | Admitting: Physician Assistant

## 2022-04-17 ENCOUNTER — Other Ambulatory Visit: Payer: Self-pay

## 2022-04-17 ENCOUNTER — Encounter: Payer: Self-pay | Admitting: Emergency Medicine

## 2022-04-17 DIAGNOSIS — B349 Viral infection, unspecified: Secondary | ICD-10-CM | POA: Diagnosis not present

## 2022-04-17 LAB — POC SARS CORONAVIRUS 2 AG -  ED: SARS Coronavirus 2 Ag: NEGATIVE

## 2022-04-17 NOTE — Discharge Instructions (Addendum)
Follow up with your ob/gyn for recheck.  Tylenol for discomfort

## 2022-04-17 NOTE — ED Triage Notes (Signed)
[redacted] weeks pregnant Nausea and vomiting Fever last night 101 Had miscarriage last December Taking Tylenlol

## 2022-04-17 NOTE — ED Provider Notes (Signed)
[facility]    CSN: [id number] Arrival date & time: 04/17/22  1621      History   Chief Complaint Chief Complaint  Patient presents with   Morning Sickness    HPI [name] is a 36 y.o. female.   Patient reports that she is [redacted] weeks pregnant patient complains of nausea and feeling fatigued.  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  She admits to possible COVID exposure.  Patient is drinking fluids well.  She denies any concerns of dehydration.  Patient has not had any abdominal pain she has not had any cramping patient denies any diarrhea.  She attempted to call her OB today but they have not returned her call     Past Medical History:  Diagnosis Date   Adrenal mass ([place])    Anemia    Celiac disease     Patient Active Problem List   Diagnosis Date Noted   Term pregnancy 10/09/2019    Past Surgical History:  Procedure Laterality Date   BOWEL RESECTION     as child   CESAREAN SECTION N/A 10/09/2019   Procedure: CESAREAN SECTION;  Surgeon: Tyson Dense, MD;  Location: [facility] LD ORS;  Service: Obstetrics;  Laterality: N/A;   NO PAST SURGERIES     WISDOM TOOTH EXTRACTION      OB History     Gravida  3   Para  1   Term  1   Preterm  0   AB  1   Living  1      SAB  1   IAB  0   Ectopic  0   Multiple  0   Live Births  1            Home Medications    Prior to Admission medications   Medication Sig Start Date End Date Taking? Authorizing Provider  acetaminophen (TYLENOL) 500 MG tablet Take 2 tablets (1,000 mg total) by mouth every 6 (six) hours. 10/11/19   Linda Hedges, DO  Prenatal Vit-Fe Fumarate-FA (PRENATAL MULTIVITAMIN) TABS tablet Take 1 tablet by mouth daily at 12 noon.    [provider]  progesterone (PROMETRIUM) 200 MG capsule Place 1 capsule (200 mg total) vaginally daily. 04/03/22   Megan Salon, MD    Family History Family History  Problem Relation Age of Onset    Cancer Mother        cervical    Social History Social History   Tobacco Use   Smoking status: Never    Passive exposure: Never   Smokeless tobacco: Never  Vaping Use   Vaping Use: Never used  Substance Use Topics   Alcohol use: Never   Drug use: Never     Allergies   Sertraline hcl   Review of Systems Review of Systems  All other systems reviewed and are negative.    Physical Exam Triage Vital Signs ED Triage Vitals  Enc Vitals Group     BP 04/17/22 1640 122/82     Pulse Rate 04/17/22 1640 88     Resp 04/17/22 1640 18     Temp 04/17/22 1640 98.6 F (37 C)     Temp Source 04/17/22 1640 Oral     SpO2 04/17/22 1640 100 %     Weight 04/17/22 1641 125 lb (56.7 kg)     Height 04/17/22 1641 5' 2"  (1.575 m)     Head Circumference --  Peak Flow --      Pain Score 04/17/22 1640 0     Pain Loc --      Pain Edu? --      Excl. in [place]? --    No data found.  Updated Vital Signs BP 122/82 (BP Location: Left Arm)   Pulse 88   Temp 98.6 F (37 C) (Oral)   Resp 18   Ht 5' 2"  (1.575 m)   Wt 56.7 kg   LMP 02/27/2022 (Exact Date)   SpO2 100%   BMI 22.86 kg/m   Visual Acuity Right Eye Distance:   Left Eye Distance:   Bilateral Distance:    Right Eye Near:   Left Eye Near:    Bilateral Near:     Physical Exam Vitals and nursing note reviewed.  Constitutional:      Appearance: She is well-developed.  HENT:     Head: Normocephalic.  Cardiovascular:     Rate and Rhythm: Normal rate.  Pulmonary:     Effort: Pulmonary effort is normal.  Abdominal:     General: There is no distension.  Musculoskeletal:        General: Normal range of motion.     Cervical back: Normal range of motion.  Skin:    General: Skin is warm.  Neurological:     General: No focal deficit present.     Mental Status: She is alert and oriented to person, place, and time.      UC Treatments / Results  Labs (all labs ordered are listed, but only abnormal results are  displayed) Labs Reviewed  POC SARS CORONAVIRUS 2 AG -  ED    EKG   Radiology No results found.  Procedures Procedures (including critical care time)  Medications Ordered in UC Medications - No data to display  Initial Impression / Assessment and Plan / UC Course  I have reviewed the triage vital signs and the nursing notes.  Pertinent labs & imaging results that were available during my care of the patient were reviewed by me and considered in my medical decision making (see chart for details).     MDM: COVID testing is negative.  Patient advised she can safely take Tylenol.  Patient is counseled on using Unisom and vitamin B6 for nausea she is advised to discuss this with her OB/GYN Final Clinical Impressions(s) / UC Diagnoses   Final diagnoses:  Viral illness     Discharge Instructions      Follow up with your ob/gyn for recheck.  Tylenol for discomfort    ED Prescriptions   None    PDMP not reviewed this encounter. An After Visit Summary was printed and given to the patient.    Fransico Meadow, [place] 04/17/22 [phone]

## 2022-04-28 ENCOUNTER — Ambulatory Visit (INDEPENDENT_AMBULATORY_CARE_PROVIDER_SITE_OTHER): Payer: BC Managed Care – PPO | Admitting: Obstetrics & Gynecology

## 2022-04-28 DIAGNOSIS — O3680X0 Pregnancy with inconclusive fetal viability, not applicable or unspecified: Secondary | ICD-10-CM

## 2022-04-28 DIAGNOSIS — Z8759 Personal history of other complications of pregnancy, childbirth and the puerperium: Secondary | ICD-10-CM | POA: Diagnosis not present

## 2022-04-29 ENCOUNTER — Ambulatory Visit (INDEPENDENT_AMBULATORY_CARE_PROVIDER_SITE_OTHER): Payer: BC Managed Care – PPO

## 2022-04-29 DIAGNOSIS — Z98891 History of uterine scar from previous surgery: Secondary | ICD-10-CM | POA: Insufficient documentation

## 2022-04-29 DIAGNOSIS — Z3A01 Less than 8 weeks gestation of pregnancy: Secondary | ICD-10-CM

## 2022-04-29 DIAGNOSIS — Z3A08 8 weeks gestation of pregnancy: Secondary | ICD-10-CM | POA: Diagnosis not present

## 2022-04-29 DIAGNOSIS — O3680X1 Pregnancy with inconclusive fetal viability, fetus 1: Secondary | ICD-10-CM | POA: Diagnosis not present

## 2022-04-29 DIAGNOSIS — F419 Anxiety disorder, unspecified: Secondary | ICD-10-CM | POA: Insufficient documentation

## 2022-04-29 DIAGNOSIS — N200 Calculus of kidney: Secondary | ICD-10-CM | POA: Insufficient documentation

## 2022-04-29 DIAGNOSIS — O09529 Supervision of elderly multigravida, unspecified trimester: Secondary | ICD-10-CM | POA: Insufficient documentation

## 2022-04-29 NOTE — Progress Notes (Signed)
GYNECOLOGY  VISIT  CC:   concerns about pregnancy  HPI: 36 y.o. G46P1011 Married White or Caucasian female here for concerns about her pregnancy.  H/o miscarriage with prior pregnancy.  Anxious about that happening again.  Denies vaginal bleeding or pain.  Has new ob appointment scheduled for next week.    Was having nausea but this resolved last week.  This has made her more anxious.  Is worried this is a sign for miscarriage because she just doesn't feel pregnancy.  Was trying to wait until new ob appt next week.  Advised to come in for bedside ultrasound.   Past Medical History:  Diagnosis Date   Adrenal mass ([place])    Anemia    Celiac disease     MEDS:   Current Outpatient Medications on File Prior to Visit  Medication Sig Dispense Refill   acetaminophen (TYLENOL) 500 MG tablet Take 2 tablets (1,000 mg total) by mouth every 6 (six) hours. 30 tablet 0   Prenatal Vit-Fe Fumarate-FA (PRENATAL MULTIVITAMIN) TABS tablet Take 1 tablet by mouth daily at 12 noon.     progesterone (PROMETRIUM) 200 MG capsule Place 1 capsule (200 mg total) vaginally daily. 30 capsule 1   No current facility-administered medications on file prior to visit.    ALLERGIES: Sertraline hcl  SH:  married, non smoker  Review of Systems  Constitutional: Negative.   Psychiatric/Behavioral:         Anxiety    PHYSICAL EXAMINATION:    LMP 02/27/2022 (Exact Date)     General appearance: alert, cooperative and appears stated age  Pelvic: External genitalia:  no lesions  Bedside ultrasound performed showing singleton IUD with EDA of 8 3/7 weeks with +FCA at 150BPM.  Normal yolk sac, gestational sac and fetal pole seen.  Pt will return for NOB appointment next week.            Chaperone, Octaviano Batty, CMA, was present for exam.  Assessment/Plan: 1. History of miscarriage - pt reassured by findings.

## 2022-04-29 NOTE — Addendum Note (Signed)
Addended by: Blenda Nicely on: 04/29/2022 02:43 PM   Modules accepted: Orders

## 2022-05-05 ENCOUNTER — Encounter (HOSPITAL_BASED_OUTPATIENT_CLINIC_OR_DEPARTMENT_OTHER): Payer: Self-pay

## 2022-05-05 ENCOUNTER — Ambulatory Visit (INDEPENDENT_AMBULATORY_CARE_PROVIDER_SITE_OTHER): Payer: BC Managed Care – PPO | Admitting: Obstetrics & Gynecology

## 2022-05-05 ENCOUNTER — Encounter (HOSPITAL_BASED_OUTPATIENT_CLINIC_OR_DEPARTMENT_OTHER): Payer: Self-pay | Admitting: Obstetrics & Gynecology

## 2022-05-05 ENCOUNTER — Ambulatory Visit (INDEPENDENT_AMBULATORY_CARE_PROVIDER_SITE_OTHER): Payer: BC Managed Care – PPO | Admitting: *Deleted

## 2022-05-05 VITALS — BP 115/81 | HR 84 | Wt 128.0 lb

## 2022-05-05 DIAGNOSIS — Z98891 History of uterine scar from previous surgery: Secondary | ICD-10-CM

## 2022-05-05 DIAGNOSIS — Z1332 Encounter for screening for maternal depression: Secondary | ICD-10-CM | POA: Diagnosis not present

## 2022-05-05 DIAGNOSIS — Z3481 Encounter for supervision of other normal pregnancy, first trimester: Secondary | ICD-10-CM

## 2022-05-05 DIAGNOSIS — R87612 Low grade squamous intraepithelial lesion on cytologic smear of cervix (LGSIL): Secondary | ICD-10-CM

## 2022-05-05 DIAGNOSIS — B977 Papillomavirus as the cause of diseases classified elsewhere: Secondary | ICD-10-CM

## 2022-05-05 DIAGNOSIS — O0991 Supervision of high risk pregnancy, unspecified, first trimester: Secondary | ICD-10-CM

## 2022-05-05 DIAGNOSIS — O09529 Supervision of elderly multigravida, unspecified trimester: Secondary | ICD-10-CM | POA: Diagnosis not present

## 2022-05-05 DIAGNOSIS — Z8759 Personal history of other complications of pregnancy, childbirth and the puerperium: Secondary | ICD-10-CM

## 2022-05-05 MED ORDER — PROGESTERONE 200 MG PO CAPS: 200.0000 mg | ORAL_CAPSULE | Freq: Every day | ORAL | 1 refills | Status: DC

## 2022-05-05 NOTE — Progress Notes (Unsigned)
History:   [name] is a 36 y.o. G3P1011 at 22w4dby {Ob dating:14516} being seen today for her first obstetrical visit.  Her obstetrical history is significant for {ob risk factors:10154}. Patient {does/does not:19097} intend to breast feed. Pregnancy history fully reviewed.  Patient reports {sx:14538}.      HISTORY: OB History  Gravida Para Term Preterm AB Living  3 1 1  0 1 1  SAB IAB Ectopic Multiple Live Births  1 0 0 0 1    # Outcome Date GA Lbr Len/2nd Weight Sex Delivery Anes PTL Lv  3 Current           2 SAB 08/03/21          1 Term 10/09/19 417w0d8 lb 5 oz (3.77 kg) M CS-LTranv Spinal  LIV     Name: Settlemyre,BOY Mckell     Apgar1: 9  Apgar5: 10    Last pap smear was done *** and was {Normal/Abnormal Appearance:21344::"normal"}  Past Medical History:  Diagnosis Date   [place]   Anemia    Celiac disease    Past Surgical History:  Procedure Laterality Date   BOWEL RESECTION     as child   CESAREAN SECTION N/A 10/09/2019   Procedure: CESAREAN SECTION;  Surgeon: LeTyson DenseMD;  [facility];  Service: Obstetrics;  Laterality: N/A;   NO PAST SURGERIES     WISDOM TOOTH EXTRACTION     Family History  Problem Relation Age of Onset   Cancer Mother        cervical   Social History   Tobacco Use   Smoking status: Never    Passive exposure: Never   Smokeless tobacco: Never  Vaping Use   Vaping Use: Never used  Substance Use Topics   Alcohol use: Never   Drug use: Never   Allergies  Allergen Reactions   Sertraline Hcl Hives and Rash   Current Outpatient Medications on File Prior to Visit  Medication Sig Dispense Refill   Prenatal Vit-Fe Fumarate-FA (PRENATAL MULTIVITAMIN) TABS tablet Take 1 tablet by mouth daily at 12 noon.     progesterone (PROMETRIUM) 200 MG capsule Place 1 capsule (200 mg total) vaginally daily. 30 capsule 1   acetaminophen (TYLENOL) 500 MG tablet Take 2 tablets (1,000 mg total) by mouth  every 6 (six) hours. (Patient not taking: Reported on 05/05/2022) 30 tablet 0   No current facility-administered medications on file prior to visit.    Review of Systems Pertinent items noted in HPI and remainder of comprehensive ROS otherwise negative.  Physical Exam:  There were no vitals filed for this visit.   ***Bedside Ultrasound for FHR check: Viable intrauterine pregnancy with positive cardiac activity noted, fetal heart rate ***bpm Patient informed that the ultrasound is considered a limited obstetric ultrasound and is not intended to be a complete ultrasound exam.  Patient also informed that the ultrasound is not being completed with the intent of assessing for fetal or placental anomalies or any pelvic abnormalities.  Explained that the purpose of today's ultrasound is to assess for fetal heart rate.  Patient acknowledges the purpose of the exam and the limitations of the study. General: well-developed, well-nourished female in no acute distress  Breasts:  normal appearance, no masses or tenderness bilaterally  Skin: normal coloration and turgor, no rashes  Neurologic: oriented, normal, negative, normal mood  Extremities: normal strength, tone, and muscle mass, ROM of all joints is normal  HEENT PERRLA, extraocular movement intact and sclera clear, anicteric  Neck supple and no masses  Cardiovascular: regular rate and rhythm  Respiratory:  no respiratory distress, normal breath sounds  Abdomen: soft, non-tender; bowel sounds normal; no masses,  no organomegaly  Pelvic: normal external genitalia, no lesions, normal vaginal mucosa, normal vaginal discharge, normal cervix, ***pap smear done. 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  Initial labs drawn. Continue prenatal vitamins. Problem list reviewed and updated. Genetic Screening discussed, {Blank multiple:19196::"First trimester screen","Quad screen","NIPS"}: {requests/ordered/declines:14581}. Ultrasound discussed; fetal anatomic survey: {requests/ordered/declines:14581}. Anticipatory guidance about prenatal visits given including labs, ultrasounds, and testing. Discussed usage of Babyscripts and virtual visits as additional source of managing and completing prenatal visits in midst of coronavirus and pandemic.   Encouraged to complete MyChart Registration for her ability to review results, send requests, and have questions addressed.  The nature of [place] for [facility] with multiple MDs and Advanced Practice Providers was explained to patient; also emphasized that residents, students are part of our team. Routine obstetric precautions reviewed. Encouraged to seek out care at office or emergency room [facility] preferred) for urgent and/or emergent concerns. No follow-ups on file.     Felipa Emory, MD, [place], [facility] for [facility], [place]

## 2022-05-06 ENCOUNTER — Other Ambulatory Visit (HOSPITAL_BASED_OUTPATIENT_CLINIC_OR_DEPARTMENT_OTHER): Payer: Self-pay

## 2022-05-06 ENCOUNTER — Other Ambulatory Visit (HOSPITAL_BASED_OUTPATIENT_CLINIC_OR_DEPARTMENT_OTHER): Payer: Self-pay | Admitting: Obstetrics & Gynecology

## 2022-05-06 ENCOUNTER — Encounter (HOSPITAL_BASED_OUTPATIENT_CLINIC_OR_DEPARTMENT_OTHER): Payer: Self-pay | Admitting: Obstetrics & Gynecology

## 2022-05-06 DIAGNOSIS — B977 Papillomavirus as the cause of diseases classified elsewhere: Secondary | ICD-10-CM | POA: Insufficient documentation

## 2022-05-06 DIAGNOSIS — Z8759 Personal history of other complications of pregnancy, childbirth and the puerperium: Secondary | ICD-10-CM | POA: Insufficient documentation

## 2022-05-06 DIAGNOSIS — R87612 Low grade squamous intraepithelial lesion on cytologic smear of cervix (LGSIL): Secondary | ICD-10-CM | POA: Insufficient documentation

## 2022-05-06 LAB — CBC
Hematocrit: 39.5 % (ref 34.0–46.6)
Hemoglobin: 12.6 g/dL (ref 11.1–15.9)
MCH: 25.7 pg — ABNORMAL LOW (ref 26.6–33.0)
MCHC: 31.9 g/dL (ref 31.5–35.7)
MCV: 81 fL (ref 79–97)
Platelets: 361 10*3/uL (ref 150–450)
RBC: 4.9 x10E6/uL (ref 3.77–5.28)
RDW: 15 % (ref 11.7–15.4)
WBC: 6.3 10*3/uL (ref 3.4–10.8)

## 2022-05-06 LAB — RPR: RPR Ser Ql: NONREACTIVE

## 2022-05-06 LAB — ANTIBODY SCREEN: Antibody Screen: NEGATIVE

## 2022-05-06 LAB — ABO/RH: Rh Factor: POSITIVE

## 2022-05-06 LAB — HIV ANTIBODY (ROUTINE TESTING W REFLEX): HIV Screen 4th Generation wRfx: NONREACTIVE

## 2022-05-06 LAB — HEPATITIS B SURFACE ANTIGEN: Hepatitis B Surface Ag: NEGATIVE

## 2022-05-06 LAB — RUBELLA SCREEN: Rubella Antibodies, IGG: 2.38 index (ref >0.99)

## 2022-05-06 LAB — HEPATITIS C ANTIBODY: Hep C Virus Ab: NONREACTIVE

## 2022-05-06 NOTE — Progress Notes (Signed)
New OB Intake  I explained I am completing New OB Intake today. We discussed her EDD of 11/1722 that is based on LMP of 02/27/22. Pt is G3/P1. I reviewed her allergies, medications, Medical/Surgical/OB history, and appropriate screenings. I informed her of [facility] services. [facility] information placed in AVS. Based on history, this is a/an  pregnancy complicated by AMA  .   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) 01/04/2020    Concerns addressed today  Delivery Plans Plans to deliver at [facility]. Patient given information for [facility] Healthy Baby website for more information about [facility] and [place]. Patient is not interested in water birth.   MyChart/Babyscripts MyChart access verified. I explained pt will have some visits in office and some virtually. Babyscripts instructions given and order placed.   Blood Pressure Cuff/Weight Scale Patient has private insurance; instructed to purchase blood pressure cuff and bring to first prenatal appt. Explained after first prenatal appt pt will check weekly and document in 79.   Anatomy US Explained first scheduled [place] will be around 19 weeks. Anatomy US scheduled for 06/2022 at 0945. Pt notified to arrive at 0930.  Labs Discussed Johnsie Cancel genetic screening with patient. Would like both Panorama and Horizon drawn at new OB visit. Routine prenatal labs needed.  Social Determinants of Health Food Insecurity: Patient denies food insecurity. WIC Referral: Patient is not interested in referral to [facility].  Transportation: Patient denies transportation needs.  Blenda Nicely, RN 05/06/2022  11:00 AM

## 2022-05-07 ENCOUNTER — Other Ambulatory Visit (HOSPITAL_BASED_OUTPATIENT_CLINIC_OR_DEPARTMENT_OTHER): Payer: Self-pay

## 2022-05-07 ENCOUNTER — Encounter (HOSPITAL_BASED_OUTPATIENT_CLINIC_OR_DEPARTMENT_OTHER): Payer: Self-pay | Admitting: *Deleted

## 2022-05-07 DIAGNOSIS — O0990 Supervision of high risk pregnancy, unspecified, unspecified trimester: Secondary | ICD-10-CM | POA: Insufficient documentation

## 2022-05-08 ENCOUNTER — Other Ambulatory Visit (HOSPITAL_BASED_OUTPATIENT_CLINIC_OR_DEPARTMENT_OTHER): Payer: Self-pay

## 2022-05-08 MED ORDER — INFLUENZA VAC SPLIT QUAD 0.5 ML IM SUSY
PREFILLED_SYRINGE | INTRAMUSCULAR | 0 refills | Status: DC
  Filled 2022-05-08: qty 0.5, 1d supply, fill #0

## 2022-05-10 ENCOUNTER — Encounter (HOSPITAL_BASED_OUTPATIENT_CLINIC_OR_DEPARTMENT_OTHER): Payer: Self-pay | Admitting: Obstetrics & Gynecology

## 2022-05-11 ENCOUNTER — Other Ambulatory Visit (HOSPITAL_BASED_OUTPATIENT_CLINIC_OR_DEPARTMENT_OTHER): Payer: Self-pay

## 2022-05-11 ENCOUNTER — Other Ambulatory Visit (HOSPITAL_BASED_OUTPATIENT_CLINIC_OR_DEPARTMENT_OTHER): Payer: Self-pay | Admitting: *Deleted

## 2022-05-11 DIAGNOSIS — O285 Abnormal chromosomal and genetic finding on antenatal screening of mother: Secondary | ICD-10-CM | POA: Insufficient documentation

## 2022-05-11 DIAGNOSIS — O0990 Supervision of high risk pregnancy, unspecified, unspecified trimester: Secondary | ICD-10-CM

## 2022-05-11 LAB — PANORAMA PRENATAL TEST FULL PANEL:PANORAMA TEST PLUS 5 ADDITIONAL MICRODELETIONS
FETAL FRACTION: 10.6
REPORT SUMMARY: HIGH — AB
TRISOMY 21 RESULT TEXT: HIGH — AB

## 2022-05-11 NOTE — Progress Notes (Signed)
Pt called office regarding genetic testing results. DOB verified. 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 Advised that next steps were to meet with genetic counselor and have an early anatomy scan done for decision making. Appt provided. Pt would like to speak with provider. Advised that I would have Dr. Sabra Heck reach out to her by the end of the day.

## 2022-05-13 ENCOUNTER — Other Ambulatory Visit (HOSPITAL_BASED_OUTPATIENT_CLINIC_OR_DEPARTMENT_OTHER): Payer: Self-pay

## 2022-05-13 DIAGNOSIS — R195 Other fecal abnormalities: Secondary | ICD-10-CM

## 2022-05-14 LAB — COMPREHENSIVE METABOLIC PANEL
ALT: 9 IU/L (ref 0–32)
AST: 15 IU/L (ref 0–40)
Albumin/Globulin Ratio: 2.1 (ref 1.2–2.2)
Albumin: 4.4 g/dL (ref 3.9–4.9)
Alkaline Phosphatase: 47 IU/L (ref 44–121)
BUN/Creatinine Ratio: 19 (ref 9–23)
BUN: 11 mg/dL (ref 6–20)
Bilirubin Total: <0.2 mg/dL (ref 0.0–1.2)
CO2: 22 mmol/L (ref 20–29)
Calcium: 10.1 mg/dL (ref 8.7–10.2)
Chloride: 101 mmol/L (ref 96–106)
Creatinine, Ser: 0.57 mg/dL (ref 0.57–1.00)
Globulin, Total: 2.1 g/dL (ref 1.5–4.5)
Glucose: 89 mg/dL (ref 70–99)
Potassium: 4.7 mmol/L (ref 3.5–5.2)
Sodium: 137 mmol/L (ref 134–144)
Total Protein: 6.5 g/dL (ref 6.0–8.5)
eGFR: 121 mL/min/{1.73_m2} (ref >59)

## 2022-05-14 LAB — HORIZON CUSTOM: REPORT SUMMARY: NEGATIVE

## 2022-05-15 ENCOUNTER — Other Ambulatory Visit (HOSPITAL_BASED_OUTPATIENT_CLINIC_OR_DEPARTMENT_OTHER): Payer: Self-pay | Admitting: Obstetrics & Gynecology

## 2022-05-15 DIAGNOSIS — R1011 Right upper quadrant pain: Secondary | ICD-10-CM

## 2022-05-19 ENCOUNTER — Encounter: Payer: Self-pay | Admitting: *Deleted

## 2022-05-19 ENCOUNTER — Ambulatory Visit: Payer: BC Managed Care – PPO | Admitting: *Deleted

## 2022-05-19 ENCOUNTER — Ambulatory Visit: Payer: BC Managed Care – PPO

## 2022-05-19 ENCOUNTER — Ambulatory Visit: Payer: BC Managed Care – PPO | Attending: Obstetrics & Gynecology

## 2022-05-19 VITALS — BP 125/77 | HR 96

## 2022-05-19 DIAGNOSIS — O09521 Supervision of elderly multigravida, first trimester: Secondary | ICD-10-CM

## 2022-05-19 DIAGNOSIS — O285 Abnormal chromosomal and genetic finding on antenatal screening of mother: Secondary | ICD-10-CM | POA: Diagnosis not present

## 2022-05-19 DIAGNOSIS — O34219 Maternal care for unspecified type scar from previous cesarean delivery: Secondary | ICD-10-CM | POA: Diagnosis not present

## 2022-05-19 DIAGNOSIS — Z3A11 11 weeks gestation of pregnancy: Secondary | ICD-10-CM | POA: Diagnosis not present

## 2022-05-19 DIAGNOSIS — O281 Abnormal biochemical finding on antenatal screening of mother: Secondary | ICD-10-CM

## 2022-05-19 DIAGNOSIS — O0990 Supervision of high risk pregnancy, unspecified, unspecified trimester: Secondary | ICD-10-CM

## 2022-05-19 NOTE — Addendum Note (Signed)
Addended by: Staci Righter on: 05/19/2022 02:44 PM   Modules accepted: Orders

## 2022-05-19 NOTE — Progress Notes (Unsigned)
[facility] at [facility] [place], [address] Phone:  [phone]   Fax:  [phone]    Name: Caroline [name] Indication: cfDNA screen indicating increased risk for Down syndrome in the current [name]  DOB: [birth year] Age: 36 y.o.   EDC: 12/04/2022 LMP: 02/28/2022 Referring Provider:  Megan Salon, MD  EGA: [id number] Counselor: Caroline Righter MS, CGC  OB Hx:: [id number] of Appointment: 05/19/2022  Accompanied by: Caroline [name], Caroline SlezakFace to Face Time: 633Minutes   Previous Testing Completed: SElinorepreviously completed carrier screening. She screened to not be [name] carrier for Cystic Fibrosis (CF), Spinal Muscular Atrophy (SMA), alpha thalassemia, and beta hemoglobinopathies. [name] negative result on carrier screening reduces the likelihood of being [name] carrier, however, does not entirely rule out the possibility.   Medical & Family History:  This is Caroline [name]'s 3rd [name]. She has [name] living, healthy child. She has had one early spontaneous loss. Reports she takes prenatal vitamins. Denies personal history of diabetes, high blood pressure, thyroid conditions, and seizures. Denies bleeding, infections, and fevers in this [name]. Denies using tobacco, alcohol, or street drugs in this [name].  Maternal ethnicity reported as Caucasian and paternal ethnicity reported as Caucasian. Denies consanguinity. Denies Jewish ancestry.  Caroline [name] [name] family history of chromosome conditions, intellectual disability, autism, birth defects, bone/skeletal disorders, blindness, deafness, blood disorders, neuromuscular disorders, still births, early infant deaths, and 3 or more [name] losses for one person on her prenatal intake questionnaire.      Genetic Counseling:  Down syndrome suspected in the current [name]. SDamyahpreviously completed cell-free DNA screening (cfDNA). This screen analyzes cell-free DNA originating  from the placenta that is found in the maternal blood circulation during [name]. This test can provide information regarding the presence or absence of extra fetal (placental) DNA for chromosomes 13, 18 and 21 as well as the sex chromosomes. Caroline [name] referred for genetic counseling as results from her cfDNA indicate an increased risk for Trisomy 239(Down syndrome).  Possible prenatal ultrasound findings associated with Down syndrome include increased nuchal translucency/cystic hygroma, absent nasal bone, ventriculomegaly, heart defects, an echogenic intracardiac focus, gastrointestinal abnormalities such as duodenal atresia, pyelectasis, shortened long bones, and an increased space between the first and second toes. Caroline [name] counseled that approximately 50% of pregnancies with Down syndrome do not demonstrate signs of the condition on prenatal ultrasound, therefore [name] normal appearing ultrasound would not rule out the possibility of the [name] being affected. Postnatally, all individuals with Down syndrome have intellectual disability and developmental delays, though [name] wide spectrum of ability is noted. Behavioral/emotional problems that are possible include anxiety, depression, ADHD, and autism. The degree of intellectual disability/developmental delays and presence of behavioral/emotional problems an individual with Down syndrome will experience cannot be predicted prenatally and early intervention/special education are instrumental for the achievement of developmental milestones after birth. Several craniofacial differences are common in individuals with Down syndrome, including [name] flattened facial profile, almond-shaped upward-slanting eyes, [name] small mouth, protruding tongue, small ears, and microcephaly. Other physical features may include [name] short neck, small hands and feet, fifth finger clinodactyly, and [name] single palmar crease. Approximately 60% of affected individuals have vision problems, including  cataracts, strabismus, and glaucoma among other vision abnormalities. Ear infections, respiratory infections, and obstructive sleep apnea are common, and 40-75% of individuals experience hearing loss. Half of all individuals with Down syndrome have heart defects, with atrioventricular septal defects being the most common.  Musculoskeletal features may include hypotonia, short stature, joint laxity, decreased bone mass with an increased risk of fractures, hip dislocations, and atlantoaxial instability. Gastrointestinal problems including duodenal atresia, small bowel stenosis, annular pancreas, imperforate anus, Hirschsprung disease, reflux, constipation, diarrhea, and Celiac disease affect 10-15% of individuals with Down syndrome. Individuals may also experience thyroid dysfunction and delayed puberty. There is also [name] slightly increased risk of leukemia. In regards to the potential prognosis of pregnancies with Down syndrome, up to 30% will end in miscarriage or stillbirth from 38 weeks' gestation until term. Of liveborn children, 96% survive the first year of life and 90% survive the first 10 years of life. We discussed that the median lifespan of individuals with Down syndrome is in their 44s. Some adults with Down syndrome are able to care for themselves, live independently, and hold steady jobs while others depend on their families for care and hands-on assistance with daily activities.  We discussed that Down syndrome typically results from having three copies of chromosome 21. This most often occurs due to [name] random error in chromosomal division during the formation of reproductive cells in [name] process called nondisjunction. [name] small percentage of cases are caused by [name] chromosomal rearrangement (translocation)  involving chromosome 21. [name] translocation in [name] person's genetics occurs when there is an exchange in chromosome material, such as when [name] segment of one chromosome breaks off and reattaches to [name] different  chromosome. If [name] parent carries [name] balanced translocation they do not have any extra or missing genetic material. However, they would have an increased risk to pass unbalanced chromosomes to their offspring. There are several possible explanations for the high risk cfDNA result. First, the [name] could truly be affected by Down syndrome. Second, the [name] could be mosaic for Down syndrome. Mosaicism occurs when an individual has two or more genetically different sets of cells in their body. Individuals who are mosaic for Down syndrome have some cells in the body with three copies of the 21st chromosome and may have other cells that are chromosomally normal. Third, the placenta could have Down syndrome while the [name] could be unaffected. This is [name] phenomenon known as confined placental mosaicism (CPM). Lastly, this could be [name] false positive result. Genetic counseling discussed with Caroline [name] and Caroline [name] that the Positive Predictive Value (PPV) is the probability that [name] [name] with [name] positive test result is truly affected with the condition. The PPV for Caroline [name] [name], calculated by the [place], is 82%.  Caroline [name] was counseled regarding diagnostic testing via Chorionic Villus Sampling (CVS) and amniocentesis. CVS involves the withdrawal of [name] small amount of chorionic villi (tissue from the developing placenta). Amniocentesis involves the removal of [name] small amount of amniotic fluid from the sac surrounding the [name]. Ultrasound guidance is used throughout both procedures. Possible procedural difficulties and complications that can arise with these procedures include maternal infection, cramping, bleeding, fluid leakage, and/or [name] loss. The risk for [name] loss with [name] CVS/amniocentesis is 1/500. We reviewed the various testing options that could be ordered on [name] CVS/amniocentesis sample, including information about what each test  is looking for, turnaround time, and the benefits/limitations of each option. Specifically, we discussed the options of FISH and [name] fetal karyotype with reflex to [name] fetal microarray if the karyotype is normal. Caroline [name] and Caroline [name] were informed that diagnostic testing is the only way to definitively determine if [name] [name] is affected by Down syndrome prenatally.   We discussed  that if Symphanie opted for diagnostic testing and results confirmed [name] diagnosis of Down syndrome, it could impact [name] management in several different ways. Some individuals may choose to terminate [name] [name] or consider adoption if [name] diagnosis of Down syndrome were confirmed. Termination for the indication of Down syndrome is not currently available in the state of [place], but is available in neighboring states such as [place]. For individuals who would not alter their [name] management regardless of testing outcomes, [name] prenatal diagnosis could allow for delivery planning and prenatal consults with various specialists that would be involved in the infant's care as well as time to plan and prepare emotionally, physically, and financially. Amisadai was also made aware that she has the option of continuing to monitor the [name] with routine ultrasounds and pursue genetic testing postnatally.  Caroline [name] and Caroline [name] were appropriately worried about the risk to this [name] and asked many good questions about the condition and available testing options. The couple felt strongly that they needed more information as soon as possible. After hearing the above information, Bethanne accepted CVS for prenatal diagnosis.      Patient Plan:  Proceed with: CVS for prenatal diagnosis.  Informed consent was obtained. All questions were answered.  Declined: Amniocentesis   Thank you for sharing in the care of Makenli with [place].  Please do not hesitate to contact us if you have any questions.  Staci Righter, MS, [place] Certified Genetic  Counselor  Genetic counseling student involved in appointment: Yes (C.D.)

## 2022-05-20 ENCOUNTER — Other Ambulatory Visit (HOSPITAL_BASED_OUTPATIENT_CLINIC_OR_DEPARTMENT_OTHER): Payer: Self-pay | Admitting: Obstetrics & Gynecology

## 2022-05-20 DIAGNOSIS — O285 Abnormal chromosomal and genetic finding on antenatal screening of mother: Secondary | ICD-10-CM

## 2022-05-22 ENCOUNTER — Telehealth: Payer: Self-pay | Admitting: Genetics

## 2022-05-22 LAB — CYTOGENETICS WEB TRACKING

## 2022-05-22 LAB — SPECIMEN STATUS REPORT

## 2022-05-22 NOTE — Telephone Encounter (Signed)
Telephone call to Kmari to review the fluorescence in situ hybridization (FISH) results. Analysis of cells derived from the cytotrophoblast revealed three chromosome 21 hybridization signals. 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 The final fetal karyotype is pending. Moriyah shared with genetic counseling that she would like to start the process of being referred for termination of pregnancy. Genetic counseling placed a phone call to [facility] to initiate a referral.

## 2022-05-26 ENCOUNTER — Other Ambulatory Visit (HOSPITAL_BASED_OUTPATIENT_CLINIC_OR_DEPARTMENT_OTHER): Payer: Self-pay

## 2022-05-28 ENCOUNTER — Telehealth: Payer: Self-pay | Admitting: Genetics

## 2022-05-28 NOTE — Telephone Encounter (Signed)
Called [name] to return final fetal karyotype results. Left voicemail with [facility] call back number.

## 2022-05-28 NOTE — Telephone Encounter (Signed)
Maeven returned genetic counseling's phone call. We discussed that the final CVS karyotype confirms a diagnosis of Down Syndrome in the current pregnancy. 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 We reviewed that the chance for recurrence in a future pregnancy is not increased above her age related risk. All questions answered.

## 2022-05-29 ENCOUNTER — Telehealth (HOSPITAL_BASED_OUTPATIENT_CLINIC_OR_DEPARTMENT_OTHER): Payer: Self-pay | Admitting: Obstetrics & Gynecology

## 2022-05-29 NOTE — Telephone Encounter (Signed)
Patient called and left a message to please cancelled all her future appointments .

## 2022-05-31 ENCOUNTER — Ambulatory Visit (HOSPITAL_BASED_OUTPATIENT_CLINIC_OR_DEPARTMENT_OTHER)
Admission: RE | Admit: 2022-05-31 | Discharge: 2022-05-31 | Disposition: A | Discharge status: Home / Self Care | Payer: BC Managed Care – PPO | Source: Ambulatory Visit | Attending: Obstetrics & Gynecology | Admitting: Obstetrics & Gynecology

## 2022-05-31 DIAGNOSIS — R1011 Right upper quadrant pain: Secondary | ICD-10-CM | POA: Insufficient documentation

## 2022-06-01 NOTE — Telephone Encounter (Signed)
LMOVM for pt to call regarding cancelling appts.

## 2022-06-02 ENCOUNTER — Encounter (HOSPITAL_BASED_OUTPATIENT_CLINIC_OR_DEPARTMENT_OTHER): Payer: BC Managed Care – PPO | Admitting: Medical

## 2022-06-04 ENCOUNTER — Ambulatory Visit (HOSPITAL_BASED_OUTPATIENT_CLINIC_OR_DEPARTMENT_OTHER): Payer: BC Managed Care – PPO

## 2022-06-04 LAB — CHROMOSOME CVS RFX CMA
Cells Analyzed: 20
Cells Counted: 20
Cells Karyotyped: 2
GTG Band Resolution: 400

## 2022-06-04 LAB — FISH, CVS PRENATAL ANEUPLOID
Cells Analyzed: 50
Cells Counted: 50

## 2022-06-04 LAB — MATERNAL CELL CONTAMINATION

## 2022-06-04 LAB — MCC TRACKING

## 2022-06-24 ENCOUNTER — Encounter (HOSPITAL_BASED_OUTPATIENT_CLINIC_OR_DEPARTMENT_OTHER): Payer: Self-pay | Admitting: Obstetrics & Gynecology

## 2022-06-24 ENCOUNTER — Ambulatory Visit (INDEPENDENT_AMBULATORY_CARE_PROVIDER_SITE_OTHER): Payer: BC Managed Care – PPO | Admitting: Obstetrics & Gynecology

## 2022-06-24 VITALS — BP 125/89 | HR 80 | Ht 62.0 in | Wt 130.4 lb

## 2022-06-24 DIAGNOSIS — Z09 Encounter for follow-up examination after completed treatment for conditions other than malignant neoplasm: Secondary | ICD-10-CM | POA: Diagnosis not present

## 2022-06-24 MED ORDER — BUSPIRONE HCL 10 MG PO TABS: 20.0000 mg | ORAL_TABLET | Freq: Two times a day (BID) | ORAL | 2 refills | Status: DC

## 2022-06-24 NOTE — Progress Notes (Signed)
GYNECOLOGY  VISIT  CC:   f/u after D&C  HPI: 36 y.o. 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  Really struggling with guilt.  Feels supported.  Is very tearful today.  Spouse is supportive.  Has discussed with PCP seeing therapist.  Referral does appear to have been made but pt has tno recievd a call from any office.  Encouraged her to reach back out to see about referral as I agree with her getting involve with long term therapy.  Bleeding is minimal.  No cramping.  Not taking anything for pain.  Not ready to talk about another pregnancy yet.     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  Has seen urology.  no follow up needed.    MEDS:   Current Outpatient Medications on File Prior to Visit  Medication Sig Dispense Refill   busPIRone (BUSPAR) 10 MG tablet Take 10 mg by mouth 2 (two) times daily.     Prenatal Vit-Fe Fumarate-FA (PRENATAL MULTIVITAMIN) TABS tablet Take 1 tablet by mouth daily at 12 noon.     progesterone (PROMETRIUM) 200 MG capsule Place 1 capsule (200 mg total) vaginally daily. (Patient not taking: Reported on 06/24/2022) 30 capsule 1   No current facility-administered medications on file prior to visit.    ALLERGIES: Sertraline hcl  SH:  married, non smoker  Review of Systems  Constitutional: Negative.   Genitourinary: Negative.     PHYSICAL EXAMINATION:    BP 125/89   Pulse 80   Ht 5' 2"  (1.575 m)   Wt 130 lb 6.4 oz (59.1 kg)   LMP 02/27/2022 (Exact Date)   Breastfeeding Unknown   BMI 23.85 kg/m     General appearance: alert, cooperative and appears stated age  Assessment/Plan: 1. Follow-up examination after gynecological surgery  2.  Grief reaction - asked pt to call me if referral has not been done and if/when she needs a referral.  I can help with that for her.    Total time with pt was 23 minutes

## 2022-06-30 ENCOUNTER — Encounter (HOSPITAL_BASED_OUTPATIENT_CLINIC_OR_DEPARTMENT_OTHER): Payer: BC Managed Care – PPO | Admitting: Advanced Practice Midwife

## 2022-07-03 ENCOUNTER — Encounter: Payer: Self-pay | Admitting: *Deleted

## 2022-07-08 ENCOUNTER — Ambulatory Visit: Payer: BC Managed Care – PPO

## 2022-07-09 ENCOUNTER — Other Ambulatory Visit (HOSPITAL_BASED_OUTPATIENT_CLINIC_OR_DEPARTMENT_OTHER): Payer: Self-pay

## 2022-07-09 MED ORDER — DOXYCYCLINE HYCLATE 100 MG PO CAPS
100.0000 mg | ORAL_CAPSULE | Freq: Two times a day (BID) | ORAL | 0 refills | Status: DC
  Filled 2022-07-09: qty 10, 5d supply, fill #0

## 2022-07-27 ENCOUNTER — Encounter (HOSPITAL_BASED_OUTPATIENT_CLINIC_OR_DEPARTMENT_OTHER): Payer: BC Managed Care – PPO | Admitting: Obstetrics & Gynecology

## 2022-08-03 ENCOUNTER — Other Ambulatory Visit (HOSPITAL_BASED_OUTPATIENT_CLINIC_OR_DEPARTMENT_OTHER): Payer: Self-pay

## 2022-08-03 MED ORDER — ONDANSETRON HCL 8 MG PO TABS
8.0000 mg | ORAL_TABLET | Freq: Three times a day (TID) | ORAL | 0 refills | Status: DC
  Filled 2022-08-03: qty 20, 7d supply, fill #0

## 2022-08-10 ENCOUNTER — Other Ambulatory Visit (HOSPITAL_BASED_OUTPATIENT_CLINIC_OR_DEPARTMENT_OTHER): Payer: Self-pay

## 2022-08-16 ENCOUNTER — Emergency Department (HOSPITAL_BASED_OUTPATIENT_CLINIC_OR_DEPARTMENT_OTHER)
Admission: EM | Admit: 2022-08-16 | Discharge: 2022-08-16 | Disposition: A | Discharge status: Home / Self Care | Payer: BC Managed Care – PPO | Attending: Emergency Medicine | Admitting: Emergency Medicine

## 2022-08-16 ENCOUNTER — Other Ambulatory Visit: Payer: Self-pay

## 2022-08-16 ENCOUNTER — Emergency Department (HOSPITAL_BASED_OUTPATIENT_CLINIC_OR_DEPARTMENT_OTHER): Payer: BC Managed Care – PPO

## 2022-08-16 ENCOUNTER — Encounter (HOSPITAL_BASED_OUTPATIENT_CLINIC_OR_DEPARTMENT_OTHER): Payer: Self-pay | Admitting: Emergency Medicine

## 2022-08-16 DIAGNOSIS — R110 Nausea: Secondary | ICD-10-CM | POA: Diagnosis not present

## 2022-08-16 DIAGNOSIS — R7309 Other abnormal glucose: Secondary | ICD-10-CM | POA: Insufficient documentation

## 2022-08-16 DIAGNOSIS — R1030 Lower abdominal pain, unspecified: Secondary | ICD-10-CM | POA: Diagnosis present

## 2022-08-16 DIAGNOSIS — R140 Abdominal distension (gaseous): Secondary | ICD-10-CM

## 2022-08-16 DIAGNOSIS — R112 Nausea with vomiting, unspecified: Secondary | ICD-10-CM

## 2022-08-16 DIAGNOSIS — D72829 Elevated white blood cell count, unspecified: Secondary | ICD-10-CM | POA: Insufficient documentation

## 2022-08-16 DIAGNOSIS — R1031 Right lower quadrant pain: Secondary | ICD-10-CM | POA: Diagnosis not present

## 2022-08-16 DIAGNOSIS — R102 Pelvic and perineal pain: Secondary | ICD-10-CM | POA: Insufficient documentation

## 2022-08-16 DIAGNOSIS — R109 Unspecified abdominal pain: Secondary | ICD-10-CM

## 2022-08-16 DIAGNOSIS — K5900 Constipation, unspecified: Secondary | ICD-10-CM

## 2022-08-16 LAB — CBC WITH DIFFERENTIAL/PLATELET
Abs Immature Granulocytes: 0.03 10*3/uL (ref 0.00–0.07)
Basophils Absolute: 0 10*3/uL (ref 0.0–0.1)
Basophils Relative: 0 %
Eosinophils Absolute: 0 10*3/uL (ref 0.0–0.5)
Eosinophils Relative: 0 %
HCT: 39.5 % (ref 36.0–46.0)
Hemoglobin: 12.6 g/dL (ref 12.0–15.0)
Immature Granulocytes: 0 %
Lymphocytes Relative: 11 %
Lymphs Abs: 1.5 10*3/uL (ref 0.7–4.0)
MCH: 27.1 pg (ref 26.0–34.0)
MCHC: 31.9 g/dL (ref 30.0–36.0)
MCV: 84.9 fL (ref 80.0–100.0)
Monocytes Absolute: 0.6 10*3/uL (ref 0.1–1.0)
Monocytes Relative: 5 %
Neutro Abs: 11.2 10*3/uL — ABNORMAL HIGH (ref 1.7–7.7)
Neutrophils Relative %: 84 %
Platelets: 301 10*3/uL (ref 150–400)
RBC: 4.65 MIL/uL (ref 3.87–5.11)
RDW: 13.5 % (ref 11.5–15.5)
WBC: 13.4 10*3/uL — ABNORMAL HIGH (ref 4.0–10.5)
nRBC: 0 % (ref 0.0–0.2)

## 2022-08-16 LAB — PREGNANCY, URINE: Preg Test, Ur: NEGATIVE

## 2022-08-16 LAB — URINALYSIS, ROUTINE W REFLEX MICROSCOPIC
Bilirubin Urine: NEGATIVE
Glucose, UA: NEGATIVE mg/dL
Hgb urine dipstick: NEGATIVE
Ketones, ur: NEGATIVE mg/dL
Leukocytes,Ua: NEGATIVE
Nitrite: NEGATIVE
Protein, ur: NEGATIVE mg/dL
Specific Gravity, Urine: 1.011 (ref 1.005–1.030)
pH: 5 (ref 5.0–8.0)

## 2022-08-16 LAB — COMPREHENSIVE METABOLIC PANEL
ALT: 8 U/L (ref 0–44)
AST: 12 U/L — ABNORMAL LOW (ref 15–41)
Albumin: 4.4 g/dL (ref 3.5–5.0)
Alkaline Phosphatase: 36 U/L — ABNORMAL LOW (ref 38–126)
Anion gap: 11 (ref 5–15)
BUN: 14 mg/dL (ref 6–20)
CO2: 24 mmol/L (ref 22–32)
Calcium: 9.4 mg/dL (ref 8.9–10.3)
Chloride: 102 mmol/L (ref 98–111)
Creatinine, Ser: 0.74 mg/dL (ref 0.44–1.00)
GFR, Estimated: >60 mL/min (ref >60)
Glucose, Bld: 102 mg/dL — ABNORMAL HIGH (ref 70–99)
Potassium: 3.7 mmol/L (ref 3.5–5.1)
Sodium: 137 mmol/L (ref 135–145)
Total Bilirubin: 0.3 mg/dL (ref 0.3–1.2)
Total Protein: 7 g/dL (ref 6.5–8.1)

## 2022-08-16 LAB — LIPASE, BLOOD: Lipase: 30 U/L (ref 11–51)

## 2022-08-16 MED ORDER — METOCLOPRAMIDE HCL 10 MG PO TABS: 10.0000 mg | ORAL_TABLET | Freq: Four times a day (QID) | ORAL | 0 refills | Status: DC | PRN

## 2022-08-16 MED ORDER — LACTULOSE 20 GM/30ML PO SOLN: ORAL | 0 refills | Status: DC

## 2022-08-16 MED ORDER — IOHEXOL 350 MG/ML SOLN
100.0000 mL | Freq: Once | INTRAVENOUS | Status: AC | PRN
  Administered 2022-08-16: 60 mL via INTRAVENOUS

## 2022-08-16 MED ORDER — DICYCLOMINE HCL 20 MG PO TABS: 20.0000 mg | ORAL_TABLET | Freq: Two times a day (BID) | ORAL | 0 refills | Status: DC

## 2022-08-16 MED ORDER — METOCLOPRAMIDE HCL 5 MG/ML IJ SOLN
10.0000 mg | Freq: Once | INTRAMUSCULAR | Status: AC
  Administered 2022-08-16: 10 mg via INTRAVENOUS
  Filled 2022-08-16: qty 2

## 2022-08-16 MED ORDER — HYDROMORPHONE HCL 1 MG/ML IJ SOLN
0.5000 mg | Freq: Once | INTRAMUSCULAR | Status: AC
  Administered 2022-08-16: 0.5 mg via INTRAVENOUS
  Filled 2022-08-16: qty 1

## 2022-08-16 MED ORDER — ONDANSETRON HCL 4 MG/2ML IJ SOLN
4.0000 mg | Freq: Once | INTRAMUSCULAR | Status: AC
  Administered 2022-08-16: 4 mg via INTRAVENOUS
  Filled 2022-08-16: qty 2

## 2022-08-16 MED ORDER — MORPHINE SULFATE (PF) 4 MG/ML IV SOLN
4.0000 mg | Freq: Once | INTRAVENOUS | Status: AC
  Administered 2022-08-16: 4 mg via INTRAVENOUS
  Filled 2022-08-16: qty 1

## 2022-08-16 MED ORDER — HYDROCODONE-ACETAMINOPHEN 5-325 MG PO TABS: 1.0000 | ORAL_TABLET | ORAL | 0 refills | Status: DC | PRN

## 2022-08-16 MED ORDER — FENTANYL CITRATE PF 50 MCG/ML IJ SOSY
100.0000 ug | PREFILLED_SYRINGE | Freq: Once | INTRAMUSCULAR | Status: AC
  Administered 2022-08-16: 50 ug via INTRAVENOUS
  Filled 2022-08-16: qty 2

## 2022-08-16 MED ORDER — DIPHENHYDRAMINE HCL 50 MG/ML IJ SOLN
12.5000 mg | Freq: Once | INTRAMUSCULAR | Status: AC
  Administered 2022-08-16: 12.5 mg via INTRAVENOUS
  Filled 2022-08-16: qty 1

## 2022-08-16 MED ORDER — SORBITOL 70 % SOLN: 400.0000 mL | TOPICAL_OIL | Freq: Once | ORAL | Status: DC

## 2022-08-16 MED ORDER — FLEET ENEMA 7-19 GM/118ML RE ENEM
1.0000 | ENEMA | Freq: Once | RECTAL | Status: AC
  Administered 2022-08-16: 1 via RECTAL

## 2022-08-16 NOTE — ED Notes (Signed)
Patient is unable to hold still so PA is ordering more meds prior to starting [place]. Patient verbalizes agreement to transvaginal but requests no chaperone be present. Exam will be done portably for patient privacy.

## 2022-08-16 NOTE — Discharge Instructions (Addendum)
PLEASE- touch base with your fertility specialist tomorrow morning ESPECIALLY if you are still having significant pain.  Alternative dosing regimen for lactulose is as follows:  Initial dose to induce rapid laxation, 30 to 45 mL orally every 1 hour until laxative effect is achieved, then reduce the dose to 30 to 45 mL (containing 20 to 30 g lactulose) 3 to 4 times daily. Adjust dosage every 1 to 2 days to achieve 2 to 3 soft formed stools daily [3].  Contact a health care provider if: You have pain that gets worse. You have a fever. You do not have a bowel movement after 4 days. You vomit. You are not hungry or you lose weight. You are bleeding from the opening between the buttocks (anus). You have thin, pencil-like stools. Get help right away if: You have a fever and your symptoms suddenly get worse. You leak stool or have blood in your stool. Your abdomen is bloated. You have severe pain in your abdomen. You feel dizzy or you faint.

## 2022-08-16 NOTE — ED Triage Notes (Signed)
Pt is on antibiotic as preventative for IVF.

## 2022-08-16 NOTE — ED Provider Notes (Signed)
[facility] Provider Note   CSN: [id number] Arrival date & time: 08/16/22  1104     History  Chief Complaint  Patient presents with   Abdominal Pain    [name] is a 37 y.o. female presents emergency department for complaint of severe abdominal pelvic pain and bloating.  Patient is currently undergoing IVF.  She had an egg harvesting procedure done this past Friday.  She was doing well until this morning and noted that her lower abdomen was extremely distended.  She began developing significantly worsening pain and nausea.  She took Zofran this morning at 6 AM without improvement.  She describes the pain as "like labor pain."  It comes in waves and during that time it is severe then goes away.  She does report that she has not had a bowel movement for the past 3 days and wonders if she could be just constipated.  She denies fevers.  She has not vomited.   Abdominal Pain      Home Medications Prior to Admission medications   Medication Sig Start Date End Date Taking? Authorizing Provider  busPIRone (BUSPAR) 10 MG tablet Take 2 tablets (20 mg total) by mouth 2 (two) times daily. 06/24/22 09/22/22  Megan Salon, MD  doxycycline (VIBRAMYCIN) 100 MG capsule Take 1 capsule (100 mg total) by mouth 2 (two) times daily for 5 days. 07/09/22     ondansetron (ZOFRAN) 8 MG tablet Take 1 tablet (8 mg total) by mouth every 8 (eight) hours. 08/03/22     Prenatal Vit-Fe Fumarate-FA (PRENATAL MULTIVITAMIN) TABS tablet Take 1 tablet by mouth daily at 12 noon.    [provider]      Allergies    Sertraline hcl    Review of Systems   Review of Systems  Gastrointestinal:  Positive for abdominal pain.    Physical Exam Updated Vital Signs LMP 02/27/2022 (Exact Date)  Physical Exam Vitals and nursing note reviewed.  Constitutional:      General: She is not in acute distress.    Appearance: She is well-developed. She is not  diaphoretic.  HENT:     Head: Normocephalic and atraumatic.     Right Ear: External ear normal.     Left Ear: External ear normal.     Nose: Nose normal.     Mouth/Throat:     Mouth: Mucous membranes are moist.  Eyes:     General: No scleral icterus.    Conjunctiva/sclera: Conjunctivae normal.  Cardiovascular:     Rate and Rhythm: Normal rate and regular rhythm.     Heart sounds: Normal heart sounds. No murmur heard.    No friction rub. No gallop.  Pulmonary:     Effort: Pulmonary effort is normal. No respiratory distress.     Breath sounds: Normal breath sounds.  Abdominal:     General: Bowel sounds are normal. There is distension.     Palpations: Abdomen is soft. There is no mass.     Tenderness: There is abdominal tenderness in the right lower quadrant and suprapubic area. There is no guarding.  Musculoskeletal:     Cervical back: Normal range of motion.  Skin:    General: Skin is warm and dry.  Neurological:     Mental Status: She is alert and oriented to person, place, and time.  Psychiatric:        Behavior: Behavior normal.     ED Results / Procedures / Treatments  Labs (all labs ordered are listed, but only abnormal results are displayed) Labs Reviewed - No data to display  EKG None  Radiology No results found.  Procedures Ultrasound ED Abd  Date/Time: 08/16/2022 12:50 PM  Performed by: Margarita Mail, PA-C Authorized by: Margarita Mail, PA-C   Procedure details:    Indications: abdominal pain     Indications comment:  Sever pelvic pain/ distension - assess for free fluid due to risk of Ovarian Hyperstimulation Syndrome   Bladder:  Visualized (BL ovaries)    Images: archived    Bladder findings:    Free pelvic fluid: not identified       Medications Ordered in ED Medications - No data to display  ED Course/ Medical Decision Making/ A&P Clinical Course as of 08/16/22 1825  Sun Aug 16, 2022  1309 Case discussed with patient's fertility  specialist he recommends cabergoline 0.5 mg, 2 tablet loading dose and then daily.  He is hoping we can get better pain control and she may be able to go home and see him in the clinic tomorrow.  He did recommend getting a pelvic ultrasound to rule out intermittent torsion. [AH]  1449 WBC(!): 13.4 [AH]  1505 I visualized and interpreted ultrasound of the pelvis.  She does have a small amount of free fluid in the pelvis on examination [AH]  1505 US PELVIC COMPLETE W TRANSVAGINAL AND TORSION R/O [AH]    Clinical Course User Index [AH] Margarita Mail, PA-C                             Medical Decision Making 37 year old female under IV F who had recent egg retrieval procedure 2 days ago presents emergency department with severe abdominal pain, distention.  Of note patient had not had a bowel movement in the last 5 days.  Differential diagnosis includes acute constipation, ovarian torsion, pelvic infection, ovarian hyperstimulation syndrome.  After review of all data points patient appears to have acute constipation.  I visualized and interpreted pelvic ultrasound which shows no evidence of torsion and only minimal amount of free fluid in the pelvis.  I visualized and interpreted CT abdomen pelvis which is a very large stool burden. Performed digital rectal exam which showed no evidence of fecal impaction.  Patient given enema here in the emergency department with fairly decent sized bowel movement.   Although she does have labs that show a leukocytosis she does not appear to have a urinary tract infection, CMP shows mildly elevated blood glucose likely acute phase reactant without any evidence of KI.  Lipase is within normal limits.   I have extremely low suspicion that her symptoms are representative of ovarian hyperstimulation syndrome however I do feel patient will need to follow closely with her IVF doctor tomorrow.    Surgery pain control, lactulose, Bentyl, Reglan.  PDMP reviewed during  this encounter.   Amount and/or Complexity of Data Reviewed Labs: ordered. Decision-making details documented in ED Course. Radiology: ordered. Decision-making details documented in ED Course.  Risk Prescription drug management.           Final Clinical Impression(s) / ED Diagnoses Final diagnoses:  None    Rx / DC Orders ED Discharge Orders     None         Margarita Mail, PA-C 08/16/22 [place], [employer], DO 08/17/22 [phone]

## 2022-08-16 NOTE — ED Notes (Signed)
Pt verbalized understanding of d/c instructions, meds, and followup care. Denies questions. VSS, no distress noted. Steady gait to exit with all belongings.  ?

## 2022-08-16 NOTE — ED Notes (Signed)
Lab draw attempt unsuccessful. ED provider made aware.

## 2022-08-16 NOTE — ED Triage Notes (Signed)
Pt had egg harvest on Friday. 2 days felt abd distention, nausea.today cramping severe ,doubling over, can't speak or walk, diaphoretic and pale.

## 2022-08-16 NOTE — ED Triage Notes (Signed)
Pt hasn't had bm in 5 days, pt has been taking miralax and stool softeners.

## 2022-08-16 NOTE — ED Notes (Signed)
Ultrasound AT BEDSIDE 

## 2022-08-17 ENCOUNTER — Encounter (HOSPITAL_BASED_OUTPATIENT_CLINIC_OR_DEPARTMENT_OTHER): Payer: BC Managed Care – PPO | Admitting: Obstetrics & Gynecology

## 2022-08-27 ENCOUNTER — Ambulatory Visit (HOSPITAL_BASED_OUTPATIENT_CLINIC_OR_DEPARTMENT_OTHER): Payer: BC Managed Care – PPO | Admitting: Obstetrics & Gynecology

## 2022-09-07 ENCOUNTER — Encounter: Payer: Self-pay | Admitting: *Deleted

## 2022-09-07 ENCOUNTER — Encounter (HOSPITAL_BASED_OUTPATIENT_CLINIC_OR_DEPARTMENT_OTHER): Payer: BC Managed Care – PPO | Admitting: Obstetrics & Gynecology

## 2022-09-26 ENCOUNTER — Other Ambulatory Visit (HOSPITAL_BASED_OUTPATIENT_CLINIC_OR_DEPARTMENT_OTHER): Payer: Self-pay | Admitting: Obstetrics & Gynecology

## 2022-09-28 ENCOUNTER — Encounter (HOSPITAL_BASED_OUTPATIENT_CLINIC_OR_DEPARTMENT_OTHER): Payer: BC Managed Care – PPO | Admitting: Obstetrics & Gynecology

## 2022-10-12 ENCOUNTER — Encounter (HOSPITAL_BASED_OUTPATIENT_CLINIC_OR_DEPARTMENT_OTHER): Payer: BC Managed Care – PPO | Admitting: Obstetrics & Gynecology

## 2022-10-26 ENCOUNTER — Encounter (HOSPITAL_BASED_OUTPATIENT_CLINIC_OR_DEPARTMENT_OTHER): Payer: BC Managed Care – PPO | Admitting: Obstetrics & Gynecology

## 2022-11-05 LAB — OB RESULTS CONSOLE RPR: RPR: NONREACTIVE

## 2022-11-05 LAB — OB RESULTS CONSOLE HIV ANTIBODY (ROUTINE TESTING): HIV: NONREACTIVE

## 2022-11-05 LAB — OB RESULTS CONSOLE RUBELLA ANTIBODY, IGM: Rubella: IMMUNE

## 2022-11-05 LAB — HEPATITIS C ANTIBODY: HCV Ab: NEGATIVE

## 2022-11-05 LAB — OB RESULTS CONSOLE ANTIBODY SCREEN: Antibody Screen: NEGATIVE

## 2022-11-05 LAB — OB RESULTS CONSOLE HEPATITIS B SURFACE ANTIGEN: Hepatitis B Surface Ag: NEGATIVE

## 2022-11-09 ENCOUNTER — Encounter (HOSPITAL_BASED_OUTPATIENT_CLINIC_OR_DEPARTMENT_OTHER): Payer: BC Managed Care – PPO | Admitting: Obstetrics & Gynecology

## 2022-11-09 LAB — OB RESULTS CONSOLE GC/CHLAMYDIA
Chlamydia: NEGATIVE
Neisseria Gonorrhea: NEGATIVE

## 2022-11-16 ENCOUNTER — Encounter (HOSPITAL_BASED_OUTPATIENT_CLINIC_OR_DEPARTMENT_OTHER): Payer: BC Managed Care – PPO | Admitting: Obstetrics & Gynecology

## 2022-11-23 ENCOUNTER — Encounter (HOSPITAL_BASED_OUTPATIENT_CLINIC_OR_DEPARTMENT_OTHER): Payer: BC Managed Care – PPO | Admitting: Obstetrics & Gynecology

## 2022-11-27 ENCOUNTER — Other Ambulatory Visit (HOSPITAL_BASED_OUTPATIENT_CLINIC_OR_DEPARTMENT_OTHER): Payer: Self-pay

## 2022-11-27 MED ORDER — OMEPRAZOLE 20 MG PO CPDR
20.0000 mg | DELAYED_RELEASE_CAPSULE | Freq: Every day | ORAL | 4 refills | Status: DC
  Filled 2022-11-27: qty 30, 30d supply, fill #0
  Filled 2022-12-22: qty 30, 30d supply, fill #1
  Filled 2023-04-24: qty 30, 30d supply, fill #2
  Filled 2023-05-26: qty 30, 30d supply, fill #3

## 2022-12-21 ENCOUNTER — Encounter (HOSPITAL_BASED_OUTPATIENT_CLINIC_OR_DEPARTMENT_OTHER): Payer: BC Managed Care – PPO | Admitting: Obstetrics & Gynecology

## 2022-12-25 ENCOUNTER — Other Ambulatory Visit (HOSPITAL_BASED_OUTPATIENT_CLINIC_OR_DEPARTMENT_OTHER): Payer: Self-pay

## 2023-04-24 ENCOUNTER — Other Ambulatory Visit (HOSPITAL_BASED_OUTPATIENT_CLINIC_OR_DEPARTMENT_OTHER): Payer: Self-pay

## 2023-04-28 ENCOUNTER — Other Ambulatory Visit (HOSPITAL_BASED_OUTPATIENT_CLINIC_OR_DEPARTMENT_OTHER): Payer: Self-pay

## 2023-05-05 LAB — OB RESULTS CONSOLE GBS: GBS: NEGATIVE

## 2023-05-13 ENCOUNTER — Encounter (HOSPITAL_COMMUNITY): Payer: Self-pay | Admitting: *Deleted

## 2023-05-14 ENCOUNTER — Telehealth (HOSPITAL_COMMUNITY): Payer: Self-pay | Admitting: *Deleted

## 2023-05-14 ENCOUNTER — Encounter (HOSPITAL_COMMUNITY): Payer: Self-pay

## 2023-05-14 NOTE — Telephone Encounter (Signed)
Preadmission screen  

## 2023-05-18 ENCOUNTER — Encounter (HOSPITAL_COMMUNITY): Payer: Self-pay

## 2023-05-18 ENCOUNTER — Telehealth (HOSPITAL_COMMUNITY): Payer: Self-pay | Admitting: *Deleted

## 2023-05-18 NOTE — Patient Instructions (Addendum)
[name]  05/18/2023   Your procedure is scheduled on:  05/28/2023  Arrive at 0530 at Entrance C on [address] at [facility]  and [employer]. You are invited to use the FREE valet parking or use the Visitor's parking deck.  Pick up the phone at the desk and dial [phone].  Call this number if you have problems the morning of surgery: [phone]  Remember:   Do not eat food:(After Midnight) Desps de medianoche.  .You may drink clear liquids until arrival at _____.  Clear liquids means a liquid you can see thru.  It can have color such as Cola or Kool aid.  Tea is OK and coffee as long as no milk or creamer of any kind.  Take these medicines the morning of surgery with A SIP OF WATER:  Omeprazole as prescribed   Do not wear jewelry, make-up or nail polish.  Do not wear lotions, powders, or perfumes. Do not wear deodorant.  Do not shave 48 hours prior to surgery.  Do not bring valuables to the hospital.  [facility] is not   responsible for any belongings or valuables brought to the hospital.  Contacts, dentures or bridgework may not be worn into surgery.  Leave suitcase in the car. After surgery it may be brought to your room.  For patients admitted to the hospital, checkout time is 11:00 AM the day of              discharge.      Please read over the following fact sheets that you were given:     Preparing for Surgery

## 2023-05-18 NOTE — Telephone Encounter (Signed)
Preadmission screen  

## 2023-05-26 ENCOUNTER — Other Ambulatory Visit (HOSPITAL_BASED_OUTPATIENT_CLINIC_OR_DEPARTMENT_OTHER): Payer: Self-pay

## 2023-05-26 ENCOUNTER — Encounter (HOSPITAL_COMMUNITY)
Admission: RE | Admit: 2023-05-26 | Discharge: 2023-05-26 | Disposition: A | Discharge status: Home / Self Care | Payer: BC Managed Care – PPO | Source: Ambulatory Visit | Attending: Obstetrics & Gynecology | Admitting: Obstetrics & Gynecology

## 2023-05-26 DIAGNOSIS — Z98891 History of uterine scar from previous surgery: Secondary | ICD-10-CM | POA: Insufficient documentation

## 2023-05-26 DIAGNOSIS — Z01812 Encounter for preprocedural laboratory examination: Secondary | ICD-10-CM | POA: Insufficient documentation

## 2023-05-26 LAB — CBC
HCT: 40.9 % (ref 36.0–46.0)
Hemoglobin: 13.3 g/dL (ref 12.0–15.0)
MCH: 27.8 pg (ref 26.0–34.0)
MCHC: 32.5 g/dL (ref 30.0–36.0)
MCV: 85.4 fL (ref 80.0–100.0)
Platelets: 308 10*3/uL (ref 150–400)
RBC: 4.79 MIL/uL (ref 3.87–5.11)
RDW: 13.3 % (ref 11.5–15.5)
WBC: 7 10*3/uL (ref 4.0–10.5)
nRBC: 0 % (ref 0.0–0.2)

## 2023-05-26 LAB — RPR: RPR Ser Ql: NONREACTIVE

## 2023-05-26 LAB — TYPE AND SCREEN
ABO/RH(D): O POS
Antibody Screen: NEGATIVE

## 2023-05-27 NOTE — H&P (Signed)
[name] is a 37 y.o. female presenting for repeat cesarean section and bilateral tubal ligation. Pregnancy complicated by anxiety, H/O depression and PPD after D&E for T-21 with first pregnancy, IVF with fetal echo noted echogenic focus on mitral valve, celiac Dx, H/S T-21, AMA> low risk Panorama. OB History     Gravida  4   Para  1   Term  1   Preterm  0   AB  2   Living  1      SAB  2   IAB  0   Ectopic  0   Multiple  0   Live Births  1          Past Medical History:  Diagnosis Date   Anemia    Celiac disease    Renal cyst    1.5cm simple, septated cyst.  Has seen urology.  no follow up needed.   Past Surgical History:  Procedure Laterality Date   BOWEL RESECTION     as child, no scar, probably had biopsies with scope.   CESAREAN SECTION N/A 10/09/2019   Procedure: CESAREAN SECTION;  Surgeon: Ranae Pila, MD;  Location: [facility] LD ORS;  Service: Obstetrics;  Laterality: N/A;   WISDOM TOOTH EXTRACTION     Family History: family history includes Breast cancer in her maternal grandmother; Cancer in her mother; Prostate cancer in her father. Social History:  reports that she has never smoked. She has never been exposed to tobacco smoke. She has never used smokeless tobacco. She reports that she does not drink alcohol and does not use drugs.     Maternal Diabetes: No Genetic Screening: Normal Maternal Ultrasounds/Referrals: Normal Fetal Ultrasounds or other Referrals:  Fetal echo > mitral valve echogenic focus Maternal Substance Abuse:  No Significant Maternal Medications:  None Significant Maternal Lab Results:  Group B Strep negative Number of Prenatal Visits:greater than 3 verified prenatal visits Maternal Vaccinations: Other Comments:  None  Review of Systems  Constitutional:  Negative for fever.  Neurological:  Negative for headaches.   Maternal Medical History:  Fetal activity: Perceived fetal activity is normal.        Last menstrual period 02/27/2022, unknown if currently breastfeeding. Exam Physical Exam Cardiovascular:     Rate and Rhythm: Normal rate.  Pulmonary:     Effort: Pulmonary effort is normal.     Prenatal labs: ABO, Rh: --/--/O POS (11/06 1914) Antibody: NEG (11/06 0905) Rubella: Immune (04/18 0000) RPR: NON REACTIVE (11/06 0930)  HBsAg: Negative (04/18 0000)  HIV: Non-reactive (04/18 0000)  GBS: Negative/-- (10/16 0000)   Assessment/Plan: 37 yo G4P1 for repeat cesarean section and bilateral tubal ligation. D/W procedure and risks including infection, organ damage, bleeding/transfusion-HIV/Hep, DVT/PE, pneumonia, wound breakdown. Permanence of BTL with failure rate and increased ectopic risk reviewed.    Roselle Locus II 05/27/2023, 5:22 PM

## 2023-05-27 NOTE — Anesthesia Preprocedure Evaluation (Signed)
Anesthesia Evaluation  Patient identified by MRN, date of birth, ID band Patient awake    Reviewed: Allergy & Precautions, H&P , NPO status , Patient's Chart, lab work & pertinent test results, reviewed documented beta blocker date and time   Airway Mallampati: II  TM Distance: >3 FB Neck ROM: full    Dental no notable dental hx. (+) Teeth Intact, Dental Advisory Given   Pulmonary neg pulmonary ROS   Pulmonary exam normal breath sounds clear to auscultation       Cardiovascular negative cardio ROS Normal cardiovascular exam Rhythm:regular Rate:Normal     Neuro/Psych  PSYCHIATRIC DISORDERS Anxiety Depression    negative neurological ROS     GI/Hepatic negative GI ROS, Neg liver ROS,,,  Endo/Other  negative endocrine ROS    Renal/GU negative Renal ROS  negative genitourinary   Musculoskeletal   Abdominal   Peds  Hematology  (+) Blood dyscrasia, anemia   Anesthesia Other Findings   Reproductive/Obstetrics (+) Pregnancy                             Anesthesia Physical Anesthesia Plan  ASA: 2  Anesthesia Plan: Spinal   Post-op Pain Management: Minimal or no pain anticipated   Induction: Intravenous  PONV Risk Score and Plan: 2 and Scopolamine patch - Pre-op and Treatment may vary due to age or medical condition  Airway Management Planned: Natural Airway and Simple Face Mask  Additional Equipment: None  Intra-op Plan:   Post-operative Plan:   Informed Consent: I have reviewed the patients History and Physical, chart, labs and discussed the procedure including the risks, benefits and alternatives for the proposed anesthesia with the patient or authorized representative who has indicated his/her understanding and acceptance.       Plan Discussed with: Anesthesiologist and CRNA  Anesthesia Plan Comments: (  )        Anesthesia Quick Evaluation

## 2023-05-28 ENCOUNTER — Encounter (HOSPITAL_COMMUNITY): Payer: Self-pay | Admitting: Obstetrics and Gynecology

## 2023-05-28 ENCOUNTER — Inpatient Hospital Stay (HOSPITAL_COMMUNITY)
Admission: RE | Admit: 2023-05-28 | Discharge: 2023-05-30 | DRG: 785 | Disposition: A | Discharge status: Home / Self Care | Payer: BC Managed Care – PPO | Attending: Obstetrics and Gynecology | Admitting: Obstetrics and Gynecology

## 2023-05-28 ENCOUNTER — Inpatient Hospital Stay (HOSPITAL_COMMUNITY): Payer: BC Managed Care – PPO | Admitting: Anesthesiology

## 2023-05-28 ENCOUNTER — Other Ambulatory Visit: Payer: Self-pay

## 2023-05-28 ENCOUNTER — Encounter (HOSPITAL_COMMUNITY)
Admission: RE | Disposition: A | Discharge status: Home / Self Care | Payer: Self-pay | Source: Home / Self Care | Attending: Obstetrics and Gynecology

## 2023-05-28 DIAGNOSIS — Z3A39 39 weeks gestation of pregnancy: Secondary | ICD-10-CM

## 2023-05-28 DIAGNOSIS — Z8042 Family history of malignant neoplasm of prostate: Secondary | ICD-10-CM | POA: Diagnosis not present

## 2023-05-28 DIAGNOSIS — O34211 Maternal care for low transverse scar from previous cesarean delivery: Principal | ICD-10-CM | POA: Diagnosis present

## 2023-05-28 DIAGNOSIS — Z302 Encounter for sterilization: Secondary | ICD-10-CM

## 2023-05-28 DIAGNOSIS — O99344 Other mental disorders complicating childbirth: Secondary | ICD-10-CM

## 2023-05-28 DIAGNOSIS — Z98891 History of uterine scar from previous surgery: Principal | ICD-10-CM

## 2023-05-28 DIAGNOSIS — O9902 Anemia complicating childbirth: Secondary | ICD-10-CM | POA: Diagnosis present

## 2023-05-28 DIAGNOSIS — O09523 Supervision of elderly multigravida, third trimester: Secondary | ICD-10-CM | POA: Diagnosis not present

## 2023-05-28 HISTORY — PX: TUBAL LIGATION: SHX77

## 2023-05-28 SURGERY — Surgical Case
Anesthesia: Spinal | Site: Abdomen

## 2023-05-28 MED ORDER — SCOPOLAMINE 1 MG/3DAYS TD PT72
1.0000 | MEDICATED_PATCH | TRANSDERMAL | Status: DC
  Administered 2023-05-28: 1.5 mg via TRANSDERMAL

## 2023-05-28 MED ORDER — DIBUCAINE (PERIANAL) 1 % EX OINT: 1.0000 | TOPICAL_OINTMENT | CUTANEOUS | Status: DC | PRN

## 2023-05-28 MED ORDER — CEFAZOLIN SODIUM-DEXTROSE 2-4 GM/100ML-% IV SOLN
INTRAVENOUS | Status: AC
  Filled 2023-05-28: qty 100

## 2023-05-28 MED ORDER — SODIUM CHLORIDE 0.9 % IR SOLN
Status: DC | PRN
  Administered 2023-05-28: 1000 mL

## 2023-05-28 MED ORDER — FENTANYL CITRATE (PF) 100 MCG/2ML IJ SOLN
INTRAMUSCULAR | Status: DC | PRN
  Administered 2023-05-28: 15 ug via INTRATHECAL

## 2023-05-28 MED ORDER — PHENYLEPHRINE 80 MCG/ML (10ML) SYRINGE FOR IV PUSH (FOR BLOOD PRESSURE SUPPORT)
PREFILLED_SYRINGE | INTRAVENOUS | Status: DC | PRN
  Administered 2023-05-28 (×2): 80 ug via INTRAVENOUS

## 2023-05-28 MED ORDER — PHENYLEPHRINE HCL-NACL 20-0.9 MG/250ML-% IV SOLN
INTRAVENOUS | Status: DC | PRN
  Administered 2023-05-28: 60 ug/min via INTRAVENOUS

## 2023-05-28 MED ORDER — ACETAMINOPHEN 500 MG PO TABS
1000.0000 mg | ORAL_TABLET | Freq: Four times a day (QID) | ORAL | Status: DC
  Administered 2023-05-28 – 2023-05-30 (×8): 1000 mg via ORAL
  Filled 2023-05-28 (×9): qty 2

## 2023-05-28 MED ORDER — MORPHINE SULFATE (PF) 0.5 MG/ML IJ SOLN
INTRAMUSCULAR | Status: AC
  Filled 2023-05-28: qty 10

## 2023-05-28 MED ORDER — PRENATAL MULTIVITAMIN CH
1.0000 | ORAL_TABLET | Freq: Every day | ORAL | Status: DC
  Administered 2023-05-28 – 2023-05-30 (×3): 1 via ORAL
  Filled 2023-05-28 (×3): qty 1

## 2023-05-28 MED ORDER — PHENYLEPHRINE 80 MCG/ML (10ML) SYRINGE FOR IV PUSH (FOR BLOOD PRESSURE SUPPORT)
PREFILLED_SYRINGE | INTRAVENOUS | Status: AC
  Filled 2023-05-28: qty 10

## 2023-05-28 MED ORDER — SIMETHICONE 80 MG PO CHEW
80.0000 mg | CHEWABLE_TABLET | Freq: Three times a day (TID) | ORAL | Status: DC
  Administered 2023-05-28 – 2023-05-29 (×5): 80 mg via ORAL
  Filled 2023-05-28 (×6): qty 1

## 2023-05-28 MED ORDER — COCONUT OIL OIL: 1.0000 | TOPICAL_OIL | Status: DC | PRN

## 2023-05-28 MED ORDER — PHENYLEPHRINE HCL-NACL 20-0.9 MG/250ML-% IV SOLN
INTRAVENOUS | Status: AC
Start: 2023-05-28 — End: ?
  Filled 2023-05-28: qty 250

## 2023-05-28 MED ORDER — DEXAMETHASONE SODIUM PHOSPHATE 10 MG/ML IJ SOLN
INTRAMUSCULAR | Status: AC
  Filled 2023-05-28: qty 1

## 2023-05-28 MED ORDER — DIPHENHYDRAMINE HCL 25 MG PO CAPS: 25.0000 mg | ORAL_CAPSULE | Freq: Four times a day (QID) | ORAL | Status: DC | PRN

## 2023-05-28 MED ORDER — CEFAZOLIN SODIUM-DEXTROSE 2-4 GM/100ML-% IV SOLN
2.0000 g | INTRAVENOUS | Status: AC
  Administered 2023-05-28: 2 g via INTRAVENOUS

## 2023-05-28 MED ORDER — MENTHOL 3 MG MT LOZG: 1.0000 | LOZENGE | OROMUCOSAL | Status: DC | PRN

## 2023-05-28 MED ORDER — SIMETHICONE 80 MG PO CHEW: 80.0000 mg | CHEWABLE_TABLET | ORAL | Status: DC | PRN

## 2023-05-28 MED ORDER — SOD CITRATE-CITRIC ACID 500-334 MG/5ML PO SOLN
ORAL | Status: AC
  Filled 2023-05-28: qty 30

## 2023-05-28 MED ORDER — IBUPROFEN 600 MG PO TABS
600.0000 mg | ORAL_TABLET | Freq: Four times a day (QID) | ORAL | Status: DC | PRN
  Administered 2023-05-29 – 2023-05-30 (×5): 600 mg via ORAL
  Filled 2023-05-28 (×6): qty 1

## 2023-05-28 MED ORDER — MORPHINE SULFATE (PF) 0.5 MG/ML IJ SOLN
INTRAMUSCULAR | Status: DC | PRN
  Administered 2023-05-28: 150 ug via INTRATHECAL

## 2023-05-28 MED ORDER — WITCH HAZEL-GLYCERIN EX PADS: 1.0000 | MEDICATED_PAD | CUTANEOUS | Status: DC | PRN

## 2023-05-28 MED ORDER — BUPIVACAINE IN DEXTROSE 0.75-8.25 % IT SOLN
INTRATHECAL | Status: DC | PRN
  Administered 2023-05-28: 1.55 mg via INTRATHECAL

## 2023-05-28 MED ORDER — OXYTOCIN-SODIUM CHLORIDE 30-0.9 UT/500ML-% IV SOLN
INTRAVENOUS | Status: DC | PRN
  Administered 2023-05-28: 30 [IU] via INTRAVENOUS

## 2023-05-28 MED ORDER — SENNOSIDES-DOCUSATE SODIUM 8.6-50 MG PO TABS
2.0000 | ORAL_TABLET | Freq: Every day | ORAL | Status: DC
  Administered 2023-05-29 – 2023-05-30 (×2): 2 via ORAL
  Filled 2023-05-28 (×2): qty 2

## 2023-05-28 MED ORDER — STERILE WATER FOR IRRIGATION IR SOLN
Status: DC | PRN
  Administered 2023-05-28: 1000 mL

## 2023-05-28 MED ORDER — DEXAMETHASONE SODIUM PHOSPHATE 10 MG/ML IJ SOLN
INTRAMUSCULAR | Status: DC | PRN
  Administered 2023-05-28: 10 mg via INTRAVENOUS

## 2023-05-28 MED ORDER — SCOPOLAMINE 1 MG/3DAYS TD PT72
MEDICATED_PATCH | TRANSDERMAL | Status: AC
  Filled 2023-05-28: qty 1

## 2023-05-28 MED ORDER — OXYTOCIN-SODIUM CHLORIDE 30-0.9 UT/500ML-% IV SOLN: 2.5000 [IU]/h | INTRAVENOUS | Status: AC

## 2023-05-28 MED ORDER — OXYTOCIN-SODIUM CHLORIDE 30-0.9 UT/500ML-% IV SOLN
INTRAVENOUS | Status: AC
  Filled 2023-05-28: qty 500

## 2023-05-28 MED ORDER — SOD CITRATE-CITRIC ACID 500-334 MG/5ML PO SOLN
30.0000 mL | ORAL | Status: AC
  Administered 2023-05-28: 30 mL via ORAL

## 2023-05-28 MED ORDER — ONDANSETRON HCL 4 MG/2ML IJ SOLN
INTRAMUSCULAR | Status: AC
Start: 2023-05-28 — End: ?
  Filled 2023-05-28: qty 2

## 2023-05-28 MED ORDER — LACTATED RINGERS IV SOLN: INTRAVENOUS | Status: AC

## 2023-05-28 MED ORDER — OXYCODONE HCL 5 MG PO TABS
5.0000 mg | ORAL_TABLET | ORAL | Status: DC | PRN
Start: 2023-05-28 — End: 2023-05-30
  Administered 2023-05-29 – 2023-05-30 (×2): 5 mg via ORAL
  Filled 2023-05-28 (×2): qty 1

## 2023-05-28 MED ORDER — HYDROMORPHONE HCL 1 MG/ML IJ SOLN
0.2000 mg | INTRAMUSCULAR | Status: DC | PRN
Start: 2023-05-28 — End: 2023-05-30

## 2023-05-28 MED ORDER — ONDANSETRON HCL 4 MG/2ML IJ SOLN
INTRAMUSCULAR | Status: DC | PRN
  Administered 2023-05-28: 4 mg via INTRAVENOUS

## 2023-05-28 MED ORDER — ZOLPIDEM TARTRATE 5 MG PO TABS: 5.0000 mg | ORAL_TABLET | Freq: Every evening | ORAL | Status: DC | PRN

## 2023-05-28 MED ORDER — LACTATED RINGERS IV SOLN: INTRAVENOUS | Status: DC

## 2023-05-28 MED ORDER — OXYTOCIN-SODIUM CHLORIDE 30-0.9 UT/500ML-% IV SOLN
INTRAVENOUS | Status: AC
Start: 2023-05-28 — End: ?
  Filled 2023-05-28: qty 500

## 2023-05-28 MED ORDER — POVIDONE-IODINE 10 % EX SWAB
2.0000 | Freq: Once | CUTANEOUS | Status: AC
Start: 2023-05-28 — End: 2023-05-28
  Administered 2023-05-28: 2 via TOPICAL

## 2023-05-28 MED ORDER — FENTANYL CITRATE (PF) 100 MCG/2ML IJ SOLN
INTRAMUSCULAR | Status: AC
  Filled 2023-05-28: qty 2

## 2023-05-28 SURGICAL SUPPLY — 36 items
ADH SKN CLS APL DERMABOND .7 (GAUZE/BANDAGES/DRESSINGS)
APL PRP STRL LF DISP 70% ISPRP (MISCELLANEOUS) ×4
APL SKNCLS STERI-STRIP NONHPOA (GAUZE/BANDAGES/DRESSINGS) ×4
BENZOIN TINCTURE PRP APPL 2/3 (GAUZE/BANDAGES/DRESSINGS) IMPLANT
CHLORAPREP W/TINT 26 (MISCELLANEOUS) ×4 IMPLANT
CLAMP UMBILICAL CORD (MISCELLANEOUS) ×2 IMPLANT
CLOTH BEACON ORANGE TIMEOUT ST (SAFETY) ×2 IMPLANT
DERMABOND ADVANCED .7 DNX12 (GAUZE/BANDAGES/DRESSINGS) IMPLANT
DRSG OPSITE POSTOP 4X10 (GAUZE/BANDAGES/DRESSINGS) ×2 IMPLANT
ELECT REM PT RETURN 9FT ADLT (ELECTROSURGICAL) ×2
ELECTRODE REM PT RTRN 9FT ADLT (ELECTROSURGICAL) ×2 IMPLANT
EXTRACTOR VACUUM BELL CUP MITY (SUCTIONS) IMPLANT
EXTRACTOR VACUUM M CUP 4 TUBE (SUCTIONS) IMPLANT
GAUZE SPONGE 4X4 12PLY STRL LF (GAUZE/BANDAGES/DRESSINGS) IMPLANT
GLOVE BIO SURGEON STRL SZ7.5 (GLOVE) ×2 IMPLANT
GLOVE BIOGEL PI IND STRL 7.0 (GLOVE) ×2 IMPLANT
GOWN SRG XL 47XLVL 4 REINF (GOWN DISPOSABLE) ×2 IMPLANT
GOWN STRL NON-REIN XL LVL4 (GOWN DISPOSABLE) ×2
GOWN STRL REUS W/TWL LRG LVL3 (GOWN DISPOSABLE) ×2 IMPLANT
KIT ABG SYR 3ML LUER SLIP (SYRINGE) ×2 IMPLANT
NDL HYPO 25X5/8 SAFETYGLIDE (NEEDLE) ×2 IMPLANT
NEEDLE HYPO 25X5/8 SAFETYGLIDE (NEEDLE) ×2 IMPLANT
NS IRRIG 1000ML POUR BTL (IV SOLUTION) ×2 IMPLANT
PACK C SECTION WH (CUSTOM PROCEDURE TRAY) ×2 IMPLANT
PAD OB MATERNITY 4.3X12.25 (PERSONAL CARE ITEMS) ×2 IMPLANT
STRIP CLOSURE SKIN 1/2X4 (GAUZE/BANDAGES/DRESSINGS) IMPLANT
SUT MNCRL 0 VIOLET CTX 36 (SUTURE) ×8 IMPLANT
SUT PDS AB 0 CTX 60 (SUTURE) ×2 IMPLANT
SUT PLAIN 0 NONE (SUTURE) IMPLANT
SUT PLAIN 2 0 (SUTURE)
SUT PLAIN 2 0 XLH (SUTURE) IMPLANT
SUT PLAIN ABS 2-0 CT1 27XMFL (SUTURE) IMPLANT
SUT VIC AB 4-0 KS 27 (SUTURE) ×2 IMPLANT
TOWEL OR 17X24 6PK STRL BLUE (TOWEL DISPOSABLE) ×2 IMPLANT
TRAY FOLEY W/BAG SLVR 14FR LF (SET/KITS/TRAYS/PACK) ×2 IMPLANT
WATER STERILE IRR 1000ML POUR (IV SOLUTION) ×2 IMPLANT

## 2023-05-28 NOTE — Progress Notes (Signed)
No change to H&P per patient history Reviewed procedure-repeat C/S and BTL All questions answered She states she understands and agrees

## 2023-05-28 NOTE — Op Note (Signed)
[name], [name] MEDICAL RECORD NO: [id number] ACCOUNT NO: [id number] DATE OF BIRTH: June 29, 1986 FACILITY: [facility] LOCATION: [id number] PHYSICIAN: Guy Sandifer. Arleta Creek, MD  Operative Report   DATE OF PROCEDURE: 05/28/2023  PREOPERATIVE DIAGNOSES: 1.  Desires repeat cesarean section. 2.  Desires permanent sterilization.  POSTOPERATIVE DIAGNOSES: 1.  Desires repeat cesarean section. 2.  Desires permanent sterilization.  PROCEDURE:  Repeat cesarean section with bilateral tubal ligation.  SURGEON:  Hulan Fess, MD  ASSISTANT:  Wyn Forster, MD  An experienced assistant was required given the standard of surgical care given the complexity of the case.  This assistant was needed for exposure, dissection, suctioning, retraction, instrument exchange, assisting with delivery with administration of fundal pressure, and for overall help during the procedure    ANESTHESIA:  Spinal by Bethena Midget, MD  FINDINGS:  Viable female infant.  Apgars and birth weight pending.   ESTIMATED BLOOD LOSS:  179 mL  SPECIMENS:  Placenta and bilateral fallopian tube segments to pathology.  INDICATIONS AND CONSENT:  This patient is a 37 year old G4, P1 at 57 and 2/7 weeks with previous cesarean section. She desires repeat cesarean section.  She also desires bilateral tubal ligation.  Options have been discussed.  Potential risks have been  discussed preoperatively including but not limited to infection, organ damage, bleeding requiring transfusion of blood products with HIV and hepatitis acquisition, DVT, PE, and pneumonia. Permanence of the tubal ligation with failure rate and increased  ectopic risk have also been reviewed. All questions were answered and consent was signed on the chart.  DESCRIPTION OF PROCEDURE:  The patient was taken to the operating room where she was identified. Spinal anesthetic was placed per Dr. Tacy Dura and she was placed in the dorsal supine position with a 15-degree left lateral  wedge. She was then prepped  vaginally with Betadine.  Foley catheter was placed and she was prepped abdominally with ChloraPrep. Timeout was done. After a 3-minute drying time, she was draped in a sterile fashion. After testing for adequate spinal anesthesia, skin was entered  through a Pfannenstiel incision taking out the old scar on the way in. Dissection was carried out to the peritoneum, which was taken down superiorly and inferiorly. The bladder flap was advanced and the bladder blade was placed. The uterus was incised in  a low transverse manner and the uterine cavity was entered bluntly with a hemostat. The incisions extended bilaterally with the fingers. Artificial rupture of membranes for clear fluid was carried out. Baby was then delivered from the vertex position  using a vacuum extractor to elevate the vertex through the incision with no pop-offs and very little traction. Nuchal cord x 1 was noted. Baby was delivered and good crying tone was noted. After 1 minute, the cord was clamped and cut and the baby was  handed to the waiting pediatrics team. Placenta was delivered. There was a section in the middle of the placenta that was mildly adherent, but does dissect completely free. Careful inspection afterward reveals the cavity to be clean. The uterus was  involuting very well and there was excellent hemostasis. The uterus was then closed in two running locking imbricating layers of 0 Monocryl suture, which achieved good hemostasis. The left fallopian tube was then identified from cornua to fimbria and  grasped in its mid ampullary portion with Babcock clamp. Knuckle of tube was then doubly ligated with two free ties of plain suture and the knuckle was sharply resected. Cautery was used to assure complete hemostasis.  Similar procedure was carried out on  the right side. Ovaries appeared normal. Lavage was carried out and good hemostasis was noted all around. Anterior peritoneum was closed in a  running fashion with a 0 Monocryl suture, which was also used to reapproximate the pyramidalis muscle in the  midline. Anterior rectus fascia was closed in a running fashion with 0 looped PDS. Subcutaneous layer was closed with plain suture and the skin was closed in a subcuticular fashion with 4-0 Vicryl on a Keith needle. Benzoin, Steri-Strips, honeycomb  dressing was applied. All counts were correct. The patient was taken to the recovery room in stable condition.     PAA D: 05/28/2023 8:47:23 am T: 05/28/2023 9:19:00 am  JOB: [id number]/ [id number]

## 2023-05-28 NOTE — Lactation Note (Signed)
This note was copied from a baby's chart. Lactation Consultation Note  Patient Name: [name] HQION'G Date: 05/28/2023 Age:37 hours Reason for consult: Initial assessment;Term  P2- MOB reports that infant has been nursing well and denies having any pain or pinching while she nurses. MOB brought her Medela Pump In Style to the hospital and collected 15 mL at some point today. This EBM was the fed to infant per MOB. MOB also reports that she had an over supply with her first child and was able to provided her child with breast milk for a year after stopping him on the breast at 8 months. MOB denies having any questions or concerns at this moment.  LC reviewed feeding infant on cue 8-12x in 24 hrs, not allowing infant to go over 3 hrs without a feeding, CDC milk storage guidelines and LC services handout. LC encouraged MOB to call lactation team for further assistance as needed.  Maternal Data Does the patient have breastfeeding experience prior to this delivery?: Yes How long did the patient breastfeed?: 8 months  Feeding Mother's Current Feeding Choice: Breast Milk  Lactation Tools Discussed/Used Flange Size: 21 Pump Education: Milk Storage  Interventions Interventions: Breast feeding basics reviewed;Education;LC Services brochure  Discharge Discharge Education: Warning signs for feeding baby Pump: DEBP;Personal  Consult Status Consult Status: Follow-up Date: 05/29/23 Follow-up type: In-patient    Dema Severin BS, IBCLC 05/28/2023, 8:12 PM

## 2023-05-28 NOTE — Anesthesia Procedure Notes (Signed)
Spinal  Patient location during procedure: OR Start time: 05/28/2023 7:31 AM End time: 05/28/2023 7:41 AM Reason for block: surgical anesthesia Staffing Performed: other anesthesia staff  Anesthesiologist: Atilano Median, DO Performed by: Bethena Midget, MD Authorized by: Bethena Midget, MD   Preanesthetic Checklist Completed: patient identified, IV checked, site marked, risks and benefits discussed, surgical consent, monitors and equipment checked, pre-op evaluation and timeout performed Spinal Block Patient position: sitting Prep: DuraPrep Patient monitoring: heart rate, cardiac monitor, continuous pulse ox and blood pressure Approach: midline Location: L3-4 Injection technique: single-shot Needle Needle type: Sprotte  Needle gauge: 24 G Needle length: 9 cm Assessment Sensory level: T4 Events: CSF return

## 2023-05-28 NOTE — Brief Op Note (Signed)
05/28/2023  8:40 AM  PATIENT:  [name]  37 y.o. female  PRE-OPERATIVE DIAGNOSIS:  previous cesarean section, malpresentation, desires sterilization  POST-OPERATIVE DIAGNOSIS:  previous cesarean section, malpresentation, desires sterilization  PROCEDURE:  Procedure(s): REPEAT CESAREAN SECTION  EDC: 06-02-23 ALLERG: ZOLOFT PREVIOUS X 1 (N/A) BILATERAL TUBAL LIGATION (Bilateral)  SURGEON:  Surgeons and Role:    * Harold Hedge, MD - Primary    * Wyn Forster, MD - Assisting  PHYSICIAN ASSISTANT:   ASSISTANTS:   ANESTHESIA:   spinal  EBL:  179 ml   BLOOD ADMINISTERED:none  DRAINS: Urinary Catheter (Foley)   LOCAL MEDICATIONS USED:  NONE  SPECIMEN:  Source of Specimen:  placenta, bilateral fallopian tube segments  DISPOSITION OF SPECIMEN:  PATHOLOGY  COUNTS:  YES  TOURNIQUET:  * No tourniquets in log *  DICTATION: .Other Dictation: Dictation Number [id number]  PLAN OF CARE: Admit to inpatient   PATIENT DISPOSITION:  PACU - hemodynamically stable.   Delay start of Pharmacological VTE agent (>24hrs) due to surgical blood loss or risk of bleeding: not applicable

## 2023-05-28 NOTE — Transfer of Care (Signed)
Immediate Anesthesia Transfer of Care Note  Patient: [name]  Procedure(s) Performed: REPEAT CESAREAN SECTION  EDC: 06-02-23 ALLERG: ZOLOFT PREVIOUS X 1 (Abdomen) BILATERAL TUBAL LIGATION (Bilateral: Abdomen)  Patient Location: PACU  Anesthesia Type:Spinal  Level of Consciousness: awake, alert , and oriented  Airway & Oxygen Therapy: Patient Spontanous Breathing  Post-op Assessment: Report given to RN and Post -op Vital signs reviewed and stable  Post vital signs: Reviewed and stable  Last Vitals:  Vitals Value Taken Time  BP 99/64 05/28/23 0903  Temp    Pulse 65 05/28/23 0910  Resp 16 05/28/23 0910  SpO2 99 % 05/28/23 0910  Vitals shown include unfiled device data.  Last Pain:  Vitals:   05/28/23 0622  TempSrc: Oral         Complications: No notable events documented.

## 2023-05-29 LAB — CBC
HCT: 30.7 % — ABNORMAL LOW (ref 36.0–46.0)
Hemoglobin: 10 g/dL — ABNORMAL LOW (ref 12.0–15.0)
MCH: 27.2 pg (ref 26.0–34.0)
MCHC: 32.6 g/dL (ref 30.0–36.0)
MCV: 83.4 fL (ref 80.0–100.0)
Platelets: 265 10*3/uL (ref 150–400)
RBC: 3.68 MIL/uL — ABNORMAL LOW (ref 3.87–5.11)
RDW: 13.2 % (ref 11.5–15.5)
WBC: 12 10*3/uL — ABNORMAL HIGH (ref 4.0–10.5)
nRBC: 0 % (ref 0.0–0.2)

## 2023-05-29 NOTE — Progress Notes (Signed)
POD # 1  Doing well. BP 101/72 (BP Location: Left Arm)   Pulse 67   Temp 98.8 F (37.1 C) (Oral)   Resp 18   Ht 5\' 2"  (1.575 m)   Wt 71.7 kg   LMP 02/27/2022 (Exact Date)   SpO2 97%   Breastfeeding Unknown   BMI 28.92 kg/m  Results for orders placed or performed during the hospital encounter of 05/28/23 (from the past 24 hour(s))  CBC     Status: Abnormal   Collection Time: 05/29/23  4:30 AM  Result Value Ref Range   WBC 12.0 (H) 4.0 - 10.5 K/uL   RBC 3.68 (L) 3.87 - 5.11 MIL/uL   Hemoglobin 10.0 (L) 12.0 - 15.0 g/dL   HCT 19.1 (L) 47.8 - 29.5 %   MCV 83.4 80.0 - 100.0 fL   MCH 27.2 26.0 - 34.0 pg   MCHC 32.6 30.0 - 36.0 g/dL   RDW 62.1 30.8 - 65.7 %   Platelets 265 150 - 400 K/uL   nRBC 0.0 0.0 - 0.2 %   Abdomen is soft and non tender  Uterus is firm Bandage is clean some drainage looks clear  POD # 1  Doing well Routine care

## 2023-05-29 NOTE — Lactation Note (Signed)
This note was copied from a baby's chart. Lactation Consultation Note  Patient Name: Girl Krisha Fantauzzi OVFIE'P Date: 05/29/2023 Age:37 hours Reason for consult: Follow-up assessment  P2, 7.6% weight loss in 21 hours.  Unsure if weight is accurate.  Mother has a history of overproducing and was able to pump 7ml on the first day of life.  Baby lying on bed when LC entered room cueing, appearing hungry and mother pumping her R breast.  Suggest mother breastfeed baby on both breasts per feeding and post pump after if she decides.  Mother agreeable. Assisted with latching baby on mother's breastfeeding pillow.  Baby latched with ease.  Provided instructions on cleaning her pump parts once home and answered questions.   Maternal Data Has patient been taught Hand Expression?: Yes Does the patient have breastfeeding experience prior to this delivery?: Yes  Feeding Mother's Current Feeding Choice: Breast Milk  LATCH Score Latch: Grasps breast easily, tongue down, lips flanged, rhythmical sucking.  Audible Swallowing: A few with stimulation  Type of Nipple: Everted at rest and after stimulation  Comfort (Breast/Nipple): Soft / non-tender  Hold (Positioning): Assistance needed to correctly position infant at breast and maintain latch.  LATCH Score: 8   Lactation Tools Discussed/Used    Interventions Interventions: Breast feeding basics reviewed;DEBP;Education  Discharge Pump: Personal;DEBP (Medela Max Flow)  Consult Status Consult Status: Follow-up Date: 05/30/23 Follow-up type: In-patient    Dahlia Byes [facility] 05/29/2023, 12:11 PM

## 2023-05-29 NOTE — Progress Notes (Signed)
CSW received consult for hx of Anxiety, Depression, and ADHD.  CSW met with MOB to offer support and complete assessment, MOB was accompanied by FOB. CSW introduced self, MOB granted CSW verbal permission to speak in front of FOB about anything. CSW explained reason for consult. MOB was welcoming, open, pleasant, and remained engaged during assessment. CSW and MOB discussed MOB's mental health history. MOB reported that she was diagnosed with postpartum depression 3 years ago that transitioned into general anxiety and depression. MOB reported that prior to pregnancy she was taking medication to treat mental health diagnoses and shared that it was helpful. MOB reported that she has taken (Prozac and Wellbutrin) in the past. MOB verbalized a plan to restart medication as she feels it would be helpful. MOB acknowledged that she was okay off her medication but was short when speaking with family at times. MOB reported that she would apologize after the fact. MOB shared that she was over worried during the pregnancy and often catastrophized about the pregnancy. CSW and MOB discussed the importance of challenging those thoughts and CSW acknowledged that infant is here and healthy. MOB reported that she has felt a huge relief since having infant. CSW asked about MOB's participation in therapy, MOB reported that she has tried it in the past but does not feel it's helpful. CSW inquired about MOB's other coping skills, MOB reported staying busy and talking to people in her support system. CSW encouraged MOB to continue utilizing coping skills. CSW and MOB discussed the importance of MOB getting rest as it may play a role in symptoms, MOB verbalized understanding and agreed. CSW inquired about how MOB was feeling emotionally since giving birth, MOB reported that she was feeling good and sleep deprived. CSW acknowledged, validated, and normalized MOB's feelings. MOB presented calm and possessed insight about her mental health.  MOB did not demonstrate any acute mental health signs/symptoms. CSW assessed for safety, MOB denied SI and HI. CSW and MOB discussed MOB speaking with her provider about the possibility of getting a prescription to restart her medication prior to discharge, MOB agreed to speak with her provider.   CSW provided education regarding the baby blues period vs. perinatal mood disorders, discussed treatment and gave resources for mental health follow up if concerns arise.  CSW recommends self-evaluation during the postpartum time period using the New Mom Checklist from Postpartum Progress and encouraged MOB to contact a medical professional if symptoms are noted at any time.    CSW asked if any resources/supports were needed, MOB reported no needs.   CSW identifies no further need for intervention and no barriers to discharge at this time.  Celso Sickle, LCSW Clinical Social Worker [facility] Cell#: [phone]

## 2023-05-29 NOTE — Lactation Note (Addendum)
This note was copied from a baby's chart. Lactation Consultation Note  Patient Name: Girl Berla Bouey VWUJW'[id number] Date: 05/29/2023 Age:37 hours   P2, Mother recently breasted.  Fitted mother with 21 flange and hand pump.  Baby currently sleeping.   Mother states she breastfed and pumped with her first child for 8 months and a had a ample supply of breastmilk. Feed on demand with cues.  Goal 8-12+ times per day after first 24 hrs.  Place baby STS if not cueing.    Suggest calling for latch assistance as needed.  Maternal Data Does the patient have breastfeeding experience prior to this delivery?: Yes How long did the patient breastfeed?: 8 months Feeding Mother's Current Feeding Choice: Breast Milk  Lactation Tools Discussed/Used  Manual pump  Interventions  Education  Consult Status  Follow up  Hardie Pulley  RN, IBCLC 05/29/2023, 7:34 AM

## 2023-05-30 MED ORDER — IBUPROFEN 600 MG PO TABS: 600.0000 mg | ORAL_TABLET | Freq: Four times a day (QID) | ORAL | 0 refills | Status: AC | PRN

## 2023-05-30 MED ORDER — FLUOXETINE HCL 20 MG PO CAPS
20.0000 mg | ORAL_CAPSULE | Freq: Every day | ORAL | Status: DC
  Administered 2023-05-30: 20 mg via ORAL
  Filled 2023-05-30: qty 1

## 2023-05-30 MED ORDER — OXYCODONE HCL 5 MG PO TABS: 5.0000 mg | ORAL_TABLET | ORAL | 0 refills | Status: AC | PRN

## 2023-05-30 MED ORDER — IBUPROFEN 600 MG PO TABS: 600.0000 mg | ORAL_TABLET | Freq: Four times a day (QID) | ORAL | 0 refills | Status: DC | PRN

## 2023-05-30 MED ORDER — FLUOXETINE HCL 20 MG PO CAPS: 20.0000 mg | ORAL_CAPSULE | Freq: Every day | ORAL | 3 refills | Status: DC

## 2023-05-30 MED ORDER — FLUOXETINE HCL 20 MG PO CAPS: 20.0000 mg | ORAL_CAPSULE | Freq: Every day | ORAL | 3 refills | Status: AC

## 2023-05-30 MED ORDER — OXYCODONE HCL 5 MG PO TABS: 5.0000 mg | ORAL_TABLET | ORAL | 0 refills | Status: DC | PRN

## 2023-05-30 NOTE — Discharge Summary (Signed)
Postpartum Discharge Summary  Date of Service May 30, 2023     Patient Name: [name] DOB: [birth year] MRN: [id number]  Date of admission: 05/28/2023 Delivery date:05/28/2023 Delivering provider: Harold Hedge Date of discharge: 05/30/2023  Admitting diagnosis: History of cesarean section [Z98.891] Intrauterine pregnancy: [redacted]w[redacted]d     Secondary diagnosis:  Principal Problem:   History of cesarean section  Additional problems: none    Discharge diagnosis: Term Pregnancy Delivered                                              Post partum procedures: not applicalbe Augmentation: N/A Complications: None  Hospital course: Sceduled C/S   37 y.o. yo [id number] at [redacted]w[redacted]d was admitted to the hospital 05/28/2023 for scheduled cesarean section with the following indication:Elective Repeat.Delivery details are as follows:  Membrane Rupture Time/Date: 8:04 AM,05/28/2023  Delivery Method:C-Section, Vacuum Assisted Operative Delivery:N/A Details of operation can be found in separate operative note.  Patient had a postpartum course complicated bynothing.  She is ambulating, tolerating a regular diet, passing flatus, and urinating well. Patient is discharged home in stable condition on  05/30/23        Newborn Data: Birth date:05/28/2023 Birth time:8:07 AM Gender:Female Living status:Living Apgars:8 ,9  Weight:3380 g    Magnesium Sulfate received: No BMZ received: No Rhophylac:N/A MMR:N/A T-DaP:Given prenatally Flu: Yes RSV Vaccine received: No Transfusion:No Immunizations administered: Immunization History  Administered Date(s) Administered   Influenza,inj,Quad PF,6+ Mos 05/08/2022   Influenza-Unspecified 05/14/2019, 04/28/2020   PFIZER(Purple Top)SARS-COV-2 Vaccination 03/18/2020   Tdap 07/06/2019    Physical exam  Vitals:   05/29/23 0602 05/29/23 1300 05/29/23 2226 05/30/23 0541  BP: 101/72 116/76 119/78 113/76  Pulse: 67 99 75 68  Resp: 18 18 18 16   Temp:   98.8 F (37.1 C) 98.4 F (36.9 C) 98.2 F (36.8 C)  TempSrc:  Oral Oral Oral  SpO2: 97%   99%  Weight:      Height:       General: alert, cooperative, and no distress Lochia: appropriate Uterine Fundus: firm Incision: Healing well with no significant drainage DVT Evaluation: No evidence of DVT seen on physical exam. Labs: Lab Results  Component Value Date   WBC 12.0 (H) 05/29/2023   HGB 10.0 (L) 05/29/2023   HCT 30.7 (L) 05/29/2023   MCV 83.4 05/29/2023   PLT 265 05/29/2023      Latest Ref Rng & Units 08/16/2022   12:21 PM  CMP  Glucose 70 - 99 mg/dL 132   BUN 6 - 20 mg/dL 14   Creatinine 4.40 - 1.00 mg/dL 1.02   Sodium 725 - 366 mmol/L 137   Potassium 3.5 - 5.1 mmol/L 3.7   Chloride 98 - 111 mmol/L 102   CO2 22 - 32 mmol/L 24   Calcium 8.9 - 10.3 mg/dL 9.4   Total Protein 6.5 - 8.1 g/dL 7.0   Total Bilirubin 0.3 - 1.2 mg/dL 0.3   Alkaline Phos 38 - 126 U/L 36   AST 15 - 41 U/L 12   ALT 0 - 44 U/L 8    Edinburgh Score:    05/29/2023    8:50 PM  Edinburgh Postnatal Depression Scale Screening Tool  I have been able to laugh and see the funny side of things. --      After visit meds:  Allergies  as of 05/30/2023       Reactions   Sertraline Hcl Hives, Rash        Medication List     STOP taking these medications    Azelaic Acid 15 % gel   omeprazole 20 MG capsule Commonly known as: PRILOSEC       TAKE these medications    FLUoxetine 20 MG capsule Commonly known as: PROZAC Take 1 capsule (20 mg total) by mouth daily.   ibuprofen 600 MG tablet Commonly known as: ADVIL Take 1 tablet (600 mg total) by mouth every 6 (six) hours as needed for cramping.   oxyCODONE 5 MG immediate release tablet Commonly known as: Oxy IR/ROXICODONE Take 1-2 tablets (5-10 mg total) by mouth every 4 (four) hours as needed for moderate pain (pain score 4-6).   prenatal multivitamin Tabs tablet Take 1 tablet by mouth in the morning.         Discharge home  in stable condition Infant Feeding: Breast Infant Disposition:home with mother Discharge instruction: per After Visit Summary and Postpartum booklet. Activity: Advance as tolerated. Pelvic rest for 6 weeks.  Diet: routine diet Anticipated Birth Control: BTL done PP Postpartum Appointment:6 weeks Additional Postpartum F/U: Incision check 1 week Future Appointments:No future appointments. Follow up Visit:      05/30/2023 Jeani Hawking, MD

## 2023-05-30 NOTE — Lactation Note (Signed)
This note was copied from a baby's chart. Lactation Consultation Note  Patient Name: Girl Anathea Pinera [id number] Date: 05/30/2023 Age:37 hours Reason for consult: Follow-up assessment;Infant weight loss;Term (8 % weight loss, milk is coming in and per mom had pumped off 20 ml fed it to the baby and BF 10 mins at 1400. LC reviewed BF D/C teaching and the [facility] resources.) Per mom this baby is breast feeding well and my milk feels like its come in. LC mentioned for an experienced BF often the volume can be greater and the importance of prevention of engorgement and tx.   Maternal Data Has patient been taught Hand Expression?: Yes  Feeding Mother's Current Feeding Choice: Breast Milk  LATCH Score    Lactation Tools Discussed/Used Tools: Pump Flange Size: 21 Breast pump type: Manual Pump Education: Milk Storage;Other (comment) (already has her pump)  Interventions Interventions: Breast feeding basics reviewed;Hand pump;LC Services brochure  Discharge Discharge Education: Engorgement and breast care;Warning signs for feeding baby Pump: Manual;DEBP;Personal  Consult Status Consult Status: Complete Date: 05/30/23    Kathrin Greathouse 05/30/2023, 2:51 PM

## 2023-05-31 ENCOUNTER — Telehealth (HOSPITAL_COMMUNITY): Payer: Self-pay | Admitting: *Deleted

## 2023-05-31 NOTE — Anesthesia Postprocedure Evaluation (Signed)
Anesthesia Post Note  Patient: Javianna Langlie  Procedure(s) Performed: REPEAT CESAREAN SECTION  EDC: 06-02-23 ALLERG: ZOLOFT PREVIOUS X 1 (Abdomen) BILATERAL TUBAL LIGATION (Bilateral: Abdomen)     Patient location during evaluation: PACU Anesthesia Type: Spinal Level of consciousness: oriented and awake and alert Pain management: pain level controlled Vital Signs Assessment: post-procedure vital signs reviewed and stable Respiratory status: spontaneous breathing, respiratory function stable and patient connected to nasal cannula oxygen Cardiovascular status: blood pressure returned to baseline and stable Postop Assessment: no headache, no backache and no apparent nausea or vomiting Anesthetic complications: no   No notable events documented.  Last Vitals:  Vitals:   05/29/23 2226 05/30/23 0541  BP: 119/78 113/76  Pulse: 75 68  Resp: 18 16  Temp: 36.9 C 36.8 C  SpO2:  99%    Last Pain:  Vitals:   05/30/23 1018  TempSrc:   PainSc: 2                  Katalyn Matin

## 2023-05-31 NOTE — Telephone Encounter (Signed)
Telephone call opened in error.

## 2023-06-01 LAB — SURGICAL PATHOLOGY

## 2023-06-11 ENCOUNTER — Telehealth (HOSPITAL_COMMUNITY): Payer: Self-pay | Admitting: *Deleted

## 2023-06-11 NOTE — Telephone Encounter (Signed)
06/11/2023  Name: [name] MRN: [id number] DOB: 1985/11/14  Reason for Call:  Transition of Care Hospital Discharge Call  Contact Status: Patient Contact Status: Message  Language assistant needed:          Follow-Up Questions:    Inocente Salles Postnatal Depression Scale:  In the Past 7 Days:    PHQ2-9 Depression Scale:     Discharge Follow-up:    Post-discharge interventions: NA  Salena Saner, RN 06/11/2023 14:23

## 2023-07-21 ENCOUNTER — Emergency Department (HOSPITAL_BASED_OUTPATIENT_CLINIC_OR_DEPARTMENT_OTHER)
Admission: EM | Admit: 2023-07-21 | Discharge: 2023-07-21 | Disposition: A | Discharge status: Home / Self Care | Payer: BC Managed Care – PPO | Attending: Emergency Medicine | Admitting: Emergency Medicine

## 2023-07-21 ENCOUNTER — Encounter (HOSPITAL_BASED_OUTPATIENT_CLINIC_OR_DEPARTMENT_OTHER): Payer: Self-pay

## 2023-07-21 ENCOUNTER — Emergency Department (HOSPITAL_BASED_OUTPATIENT_CLINIC_OR_DEPARTMENT_OTHER): Payer: BC Managed Care – PPO

## 2023-07-21 ENCOUNTER — Other Ambulatory Visit: Payer: Self-pay

## 2023-07-21 DIAGNOSIS — R519 Headache, unspecified: Secondary | ICD-10-CM | POA: Diagnosis present

## 2023-07-21 DIAGNOSIS — B0231 Zoster conjunctivitis: Secondary | ICD-10-CM | POA: Diagnosis not present

## 2023-07-21 MED ORDER — VALACYCLOVIR HCL 1 G PO TABS
1000.0000 mg | ORAL_TABLET | Freq: Three times a day (TID) | ORAL | 0 refills | Status: AC
Start: 2023-07-21 — End: ?

## 2023-07-21 MED ORDER — FLUORESCEIN SODIUM 1 MG OP STRP
1.0000 | ORAL_STRIP | Freq: Once | OPHTHALMIC | Status: AC
Start: 2023-07-21 — End: 2023-07-21
  Administered 2023-07-21: 1 via OPHTHALMIC
  Filled 2023-07-21: qty 1

## 2023-07-21 MED ORDER — TETRACAINE HCL 0.5 % OP SOLN
2.0000 [drp] | Freq: Once | OPHTHALMIC | Status: AC
Start: 2023-07-21 — End: 2023-07-21
  Administered 2023-07-21: 2 [drp] via OPHTHALMIC
  Filled 2023-07-21: qty 4

## 2023-07-21 MED ORDER — KETOROLAC TROMETHAMINE 60 MG/2ML IM SOLN
30.0000 mg | Freq: Once | INTRAMUSCULAR | Status: AC
  Administered 2023-07-21: 30 mg via INTRAMUSCULAR
  Filled 2023-07-21: qty 2

## 2023-07-21 MED ORDER — ERYTHROMYCIN 5 MG/GM OP OINT: TOPICAL_OINTMENT | Freq: Four times a day (QID) | OPHTHALMIC | 0 refills | Status: AC

## 2023-07-21 NOTE — ED Triage Notes (Signed)
 Pt c/o facial swelling and 10 days of a headache.  Woke up today and had difficulty opening her right eye.

## 2023-07-21 NOTE — ED Provider Notes (Signed)
 [facility] AT [facility] Provider Note   CSN: [id number] Arrival date & time: 07/21/23  1152     History  Chief Complaint  Patient presents with   Headache    [name] is a 38 y.o. female.  Patient is a 38 year old female who presents with a right sided headache and rash.  She said she has had about a 1 week to 10-day history of pain in the right side of her head.  It goes around her right eye.  About 2 to 3 days ago she noticed a rash that developed to her right forehead and is spreading down her right eyelid.  She said her right eyelid was bit swollen this morning.  She denies any weakness of the face.  No difficulty chewing.  No change in her vision or blurry vision.  No drainage from the eye.  No nausea or vomiting.  No fevers.  No neck pain.  No numbness or weakness to her extremities.  No difficulty with her balance.  She is currently breast-feeding.  She status post cesarean section 7 weeks ago.  She had a BTL at that time.       Home Medications Prior to Admission medications   Medication Sig Start Date End Date Taking? Authorizing Provider  erythromycin  ophthalmic ointment Place into the right eye 4 (four) times daily. Place a 1/2 inch ribbon of ointment into the lower eyelid. 07/21/23  Yes Lenor Hollering, MD  valACYclovir  (VALTREX ) 1000 MG tablet Take 1 tablet (1,000 mg total) by mouth 3 (three) times daily. 07/21/23  Yes Lenor Hollering, MD  FLUoxetine  (PROZAC ) 20 MG capsule Take 1 capsule (20 mg total) by mouth daily. 05/30/23   Mat Browning, MD  ibuprofen  (ADVIL ) 600 MG tablet Take 1 tablet (600 mg total) by mouth every 6 (six) hours as needed for cramping. 05/30/23   Mat Browning, MD  oxyCODONE  (OXY IR/ROXICODONE ) 5 MG immediate release tablet Take 1-2 tablets (5-10 mg total) by mouth every 4 (four) hours as needed for moderate pain (pain score 4-6). 05/30/23   Mat Browning, MD  Prenatal Vit-Fe Fumarate-FA (PRENATAL  MULTIVITAMIN) TABS tablet Take 1 tablet by mouth in the morning.    [provider]      Allergies    Sertraline hcl    Review of Systems   Review of Systems  Constitutional:  Negative for chills, diaphoresis, fatigue and fever.  HENT:  Negative for congestion, rhinorrhea and sneezing.   Eyes: Negative.   Respiratory:  Negative for cough, chest tightness and shortness of breath.   Cardiovascular:  Negative for chest pain and leg swelling.  Gastrointestinal:  Negative for abdominal pain, blood in stool, diarrhea, nausea and vomiting.  Genitourinary:  Negative for difficulty urinating, flank pain, frequency and hematuria.  Musculoskeletal:  Negative for arthralgias and back pain.  Skin:  Positive for rash.  Neurological:  Positive for headaches. Negative for dizziness, speech difficulty, weakness and numbness.    Physical Exam Updated Vital Signs BP 122/89   Pulse 66   Temp 98.5 F (36.9 C) (Oral)   Resp 14   LMP 02/27/2022 (Exact Date)   SpO2 100%  Physical Exam Constitutional:      Appearance: She is well-developed.  HENT:     Head: Normocephalic and atraumatic.  Eyes:     Pupils: Pupils are equal, round, and reactive to light.     Comments: Mild injection of the conjunctiva.  Slit-lamp exam was performed with fluorescein   staining.  At least 2 dendritic appearing areas were seen.  Neck:     Comments: No meningismus Cardiovascular:     Rate and Rhythm: Normal rate and regular rhythm.     Heart sounds: Normal heart sounds.  Pulmonary:     Effort: Pulmonary effort is normal. No respiratory distress.     Breath sounds: Normal breath sounds. No wheezing or rales.  Chest:     Chest wall: No tenderness.  Abdominal:     General: Bowel sounds are normal.     Palpations: Abdomen is soft.     Tenderness: There is no abdominal tenderness. There is no guarding or rebound.  Musculoskeletal:        General: Normal range of motion.     Cervical back: Normal range of  motion and neck supple.  Lymphadenopathy:     Cervical: No cervical adenopathy.  Skin:    General: Skin is warm and dry.     Findings: Rash present.     Comments: Erythematous, raised lesions to the right forehead, no discrete vesicles are noted.  There are some mild erythema to the upper eyelid.    Neurological:     Mental Status: She is alert and oriented to person, place, and time.     Comments: Motor 5/5 all extremities Sensation grossly intact to LT all extremities Finger to Nose intact, no pronator drift CN II-XII grossly intact Gait normal      ED Results / Procedures / Treatments   Labs (all labs ordered are listed, but only abnormal results are displayed) Labs Reviewed - No data to display  EKG None  Radiology CT Head Wo Contrast Result Date: 07/21/2023 CLINICAL DATA:  Facial swelling and 10 day history of headache. EXAM: CT HEAD WITHOUT CONTRAST TECHNIQUE: Contiguous axial images were obtained from the base of the skull through the vertex without intravenous contrast. RADIATION DOSE REDUCTION: This exam was performed according to the departmental dose-optimization program which includes automated exposure control, adjustment of the mA and/or kV according to patient size and/or use of iterative reconstruction technique. COMPARISON:  None Available. FINDINGS: Brain: No evidence of acute infarction, hemorrhage, hydrocephalus, extra-axial collection or mass lesion/mass effect. Vascular: No hyperdense vessel or unexpected calcification. Skull: No evidence for fracture. No worrisome lytic or sclerotic lesion. Sinuses/Orbits: The visualized paranasal sinuses and mastoid air cells are clear. Visualized portions of the globes and intraorbital fat are unremarkable. Other: None IMPRESSION: No acute intracranial abnormality. Electronically Signed   By: Camellia Candle M.D.   On: 07/21/2023 12:54    Procedures Procedures    Medications Ordered in ED Medications  tetracaine  (PONTOCAINE)  0.5 % ophthalmic solution 2 drop (2 drops Right Eye Given by Other 07/21/23 1323)  fluorescein  ophthalmic strip 1 strip (1 strip Right Eye Given by Other 07/21/23 1323)  ketorolac  (TORADOL ) injection 30 mg (30 mg Intramuscular Given 07/21/23 1408)    ED Course/ Medical Decision Making/ A&P                                 Medical Decision Making Amount and/or Complexity of Data Reviewed Radiology: ordered.  Risk Prescription drug management.   Patient is a 38 year old who presents with a headache that started about 7 to 10 days ago and a rash to the right forehead that started about 2 to 3 days ago.  The rash looks concerning for shingles.  There also appears to be some  eye involvement with concerns for dendrites visualized.  I suspect this is the etiology of the headache.  She did have a head CT which did not show any acute abnormality.  Given the overlying rash that appears to be consistent with shingles, I would have a lower suspicion for other things such as venous sinus thrombosis.  She does not have any other symptoms that be more concerning for viral meningitis.  Her headache seems to be well localized to the right area where the rash is.  I did contact Dr. Fleeta with ophthalmology.  She will see the patient in the office today for an eye exam.  Patient is amenable to meeting her in the office today at 430.  Will start Valtrex  and erythromycin  eye ointment per Dr. Roxianne request.  She was discharged home in good condition.  She was encouraged to follow-up with her primary care doctor regarding the ongoing management of the shingles but will also follow-up with Dr. Fleeta today regarding the concern for ophthalmologic involvement.  Return precautions were given.  Final Clinical Impression(s) / ED Diagnoses Final diagnoses:  Herpes zoster conjunctivitis    Rx / DC Orders ED Discharge Orders          Ordered    valACYclovir  (VALTREX ) 1000 MG tablet  3 times daily        07/21/23 1507     erythromycin  ophthalmic ointment  4 times daily        07/21/23 1507              Lenor Hollering, MD 07/21/23 1516

## 2023-07-21 NOTE — Discharge Instructions (Addendum)
 Start taking the Valtrex as discussed.  Meet Dr. Zenaida Niece at her ophthalmology clinic today at 430.  Make sure that you follow-up with your primary care doctor.  Return to emergency room if you have any worsening symptoms.

## 2024-01-12 IMAGING — MR MR ABDOMEN WO/W CM
19 series · 48 of 48 positions shown · IV contrast (multihance)
Comparison: CT abdomen pelvis, 05/11/2021

CLINICAL DATA: Characterize right renal lesion incidentally
identified by prior CT

EXAM:
MRI ABDOMEN WITHOUT AND WITH CONTRAST
TECHNIQUE: Multiplanar multisequence MR imaging of the abdomen was performed
both before and after the administration of intravenous contrast.
CONTRAST:  11mL MULTIHANCE GADOBENATE DIMEGLUMINE 529 MG/ML IV SOLN

[Series 3: T2 · coronal · 5.0mm · 0.78mm/px · 2 of 16 slices shown (1 of 3)]
[im 1/16]
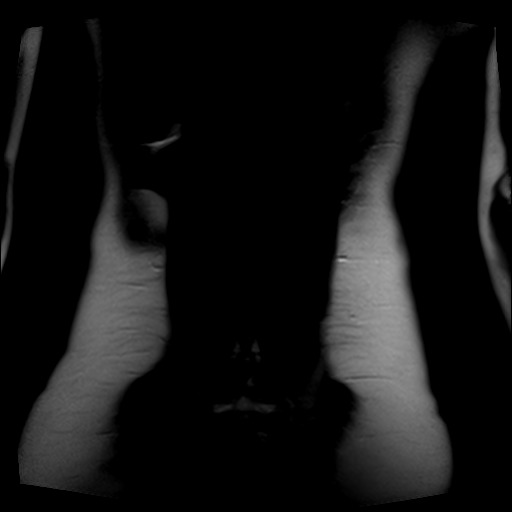
[im 16/16]
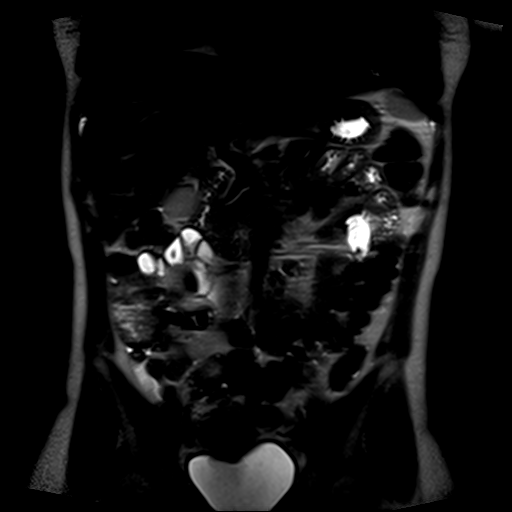

[Series 4: T2 · axial · 5.0mm · 0.66mm/px · 1 of 24 slices shown (2 of 3)]
[im 1/24]
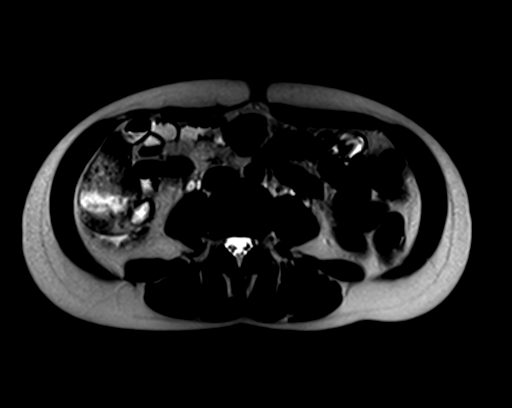

[Series 5: bSSFP · axial · 4.0mm · 0.74mm/px · z∈[-40,+108]mm · 2 of 38 slices shown]
[im 1/38]
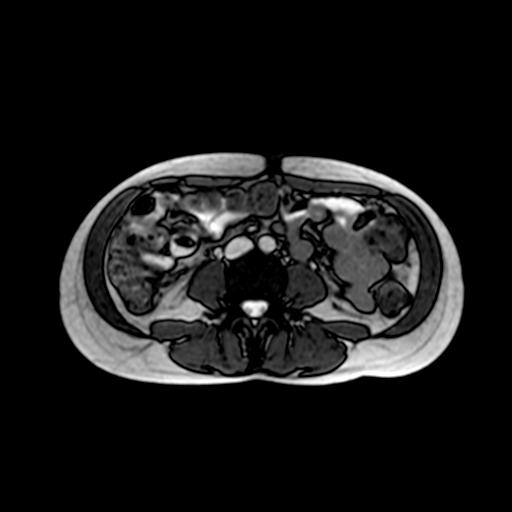
[im 38/38]
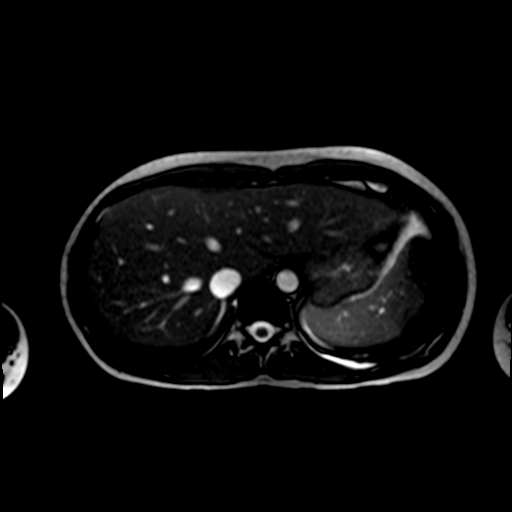

[Series 6: T1 · axial · 5.0mm · 0.66mm/px · z∈[-38,+100]mm · 2 of 48 slices shown]
[im 1/48]
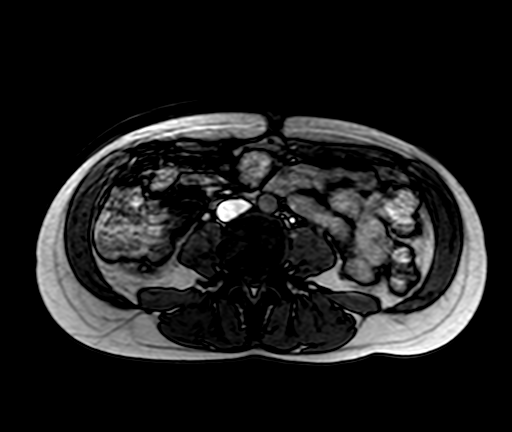
[im 48/48]
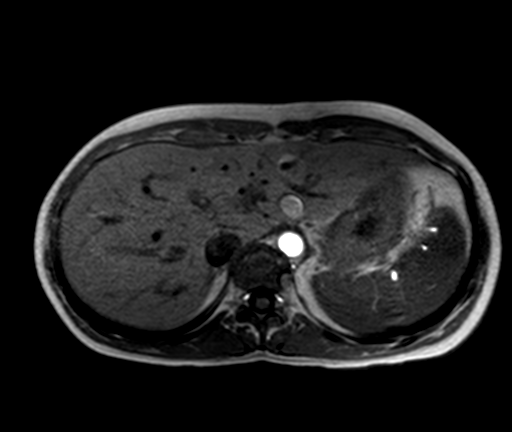

[Series 7: ep2d_diff_b50_500_800_p2_trig · axial · 6.0mm · 1.98mm/px · z∈[-17,+139]mm · 3 of 63 slices shown]
[im 1/63]
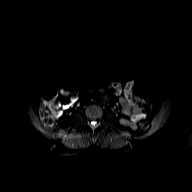
[im 32/63]
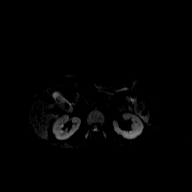
[im 63/63]
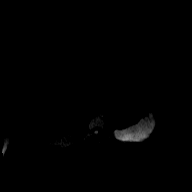

[Series 8: ep2d_diff_b50_500_800_p2_trig_adc · axial · 6.0mm · 1.98mm/px · 1 of 21 slices shown]
[im 1/21]
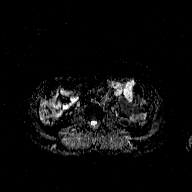

[Series 9: T2 · axial · 5.0mm · 1.33mm/px · 1 of 25 slices shown (3 of 3)]
[im 1/25]
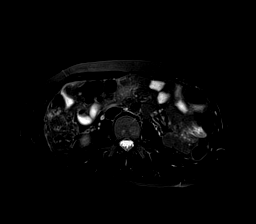

[Series 10: T1 dynamic · axial · non-contrast · 2.3mm · 1.41mm/px · z∈[-37,+99]mm · 3 of 60 slices shown]
[im 1/60]
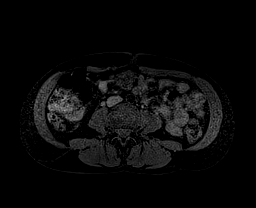
[im 30/60]
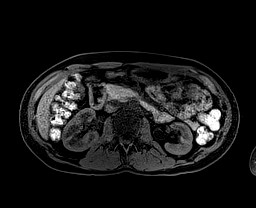
[im 60/60]
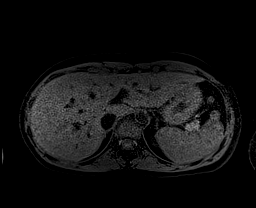

[Series 11: post 30 sec · axial · 2.3mm · 1.41mm/px · z∈[-37,+99]mm · 3 of 60 slices shown]
[im 1/60]
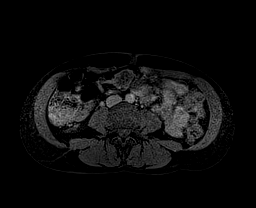
[im 30/60]
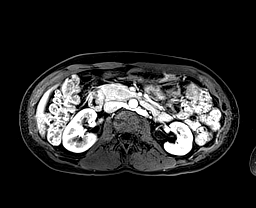
[im 60/60]
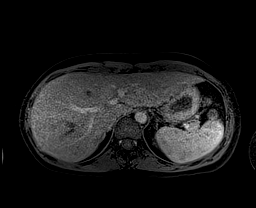

[Series 12: post 30 sec_sub · axial · 2.3mm · 1.41mm/px · z∈[-37,+99]mm · 3 of 60 slices shown]
[im 1/60]
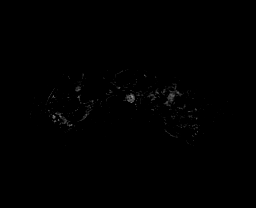
[im 30/60]
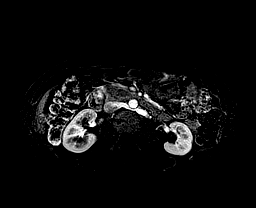
[im 60/60]
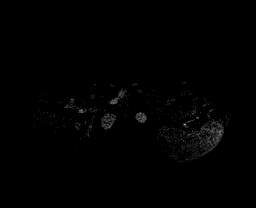

[Series 13: post 60 sec · axial · 2.3mm · 1.41mm/px · z∈[-37,+99]mm · 3 of 60 slices shown]
[im 1/60]
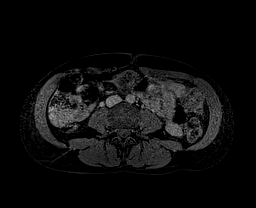
[im 30/60]
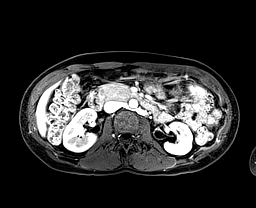
[im 60/60]
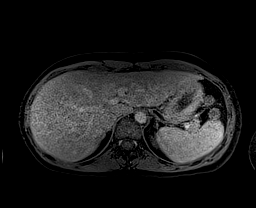

[Series 14: post 60 sec_sub · axial · 2.3mm · 1.41mm/px · z∈[-37,+99]mm · 3 of 60 slices shown]
[im 1/60]
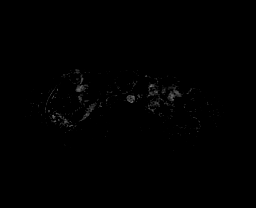
[im 30/60]
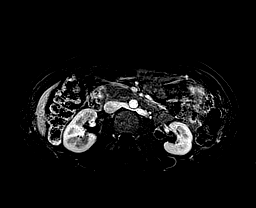
[im 60/60]
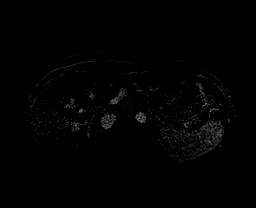

[Series 15: post 90 sec · axial · 2.3mm · 1.41mm/px · z∈[-37,+99]mm · 3 of 60 slices shown]
[im 1/60]
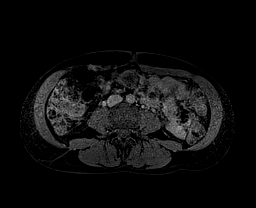
[im 30/60]
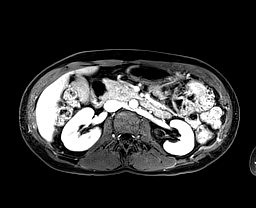
[im 60/60]
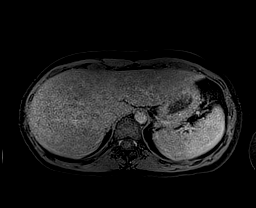

[Series 16: post 90 sec_sub · axial · 2.3mm · 1.41mm/px · z∈[-37,+99]mm · 3 of 60 slices shown]
[im 1/60]
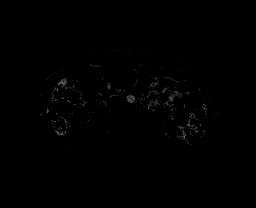
[im 30/60]
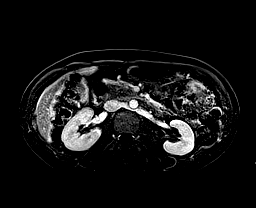
[im 60/60]
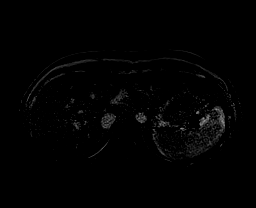

[Series 17: T1 dynamic post-contrast · coronal · 2.6mm · 0.78mm/px · 3 of 52 slices shown]
[im 1/52]
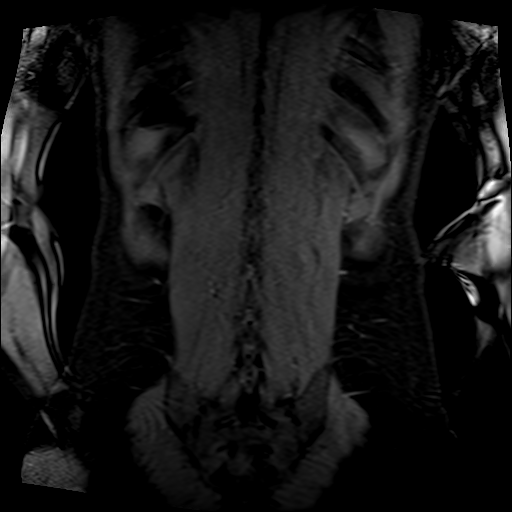
[im 26/52]
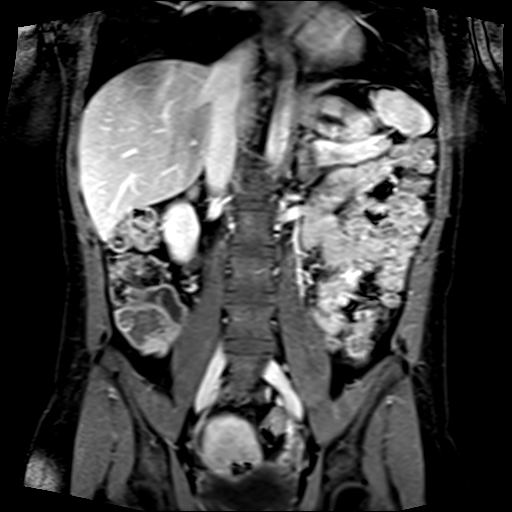
[im 52/52]
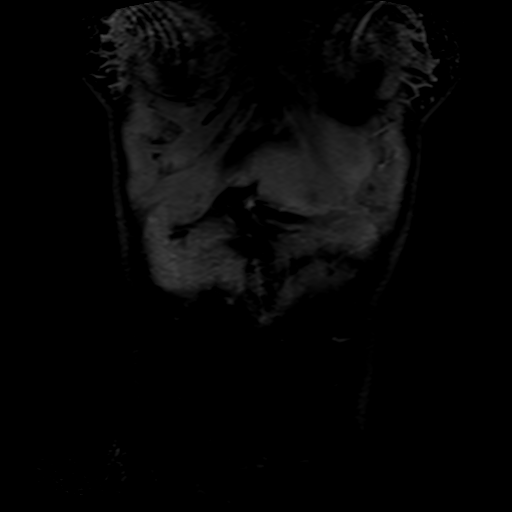

[Series 18: post axial 3 · axial · 2.3mm · 1.41mm/px · z∈[-37,+99]mm · 3 of 60 slices shown (1 of 2)]
[im 1/60]
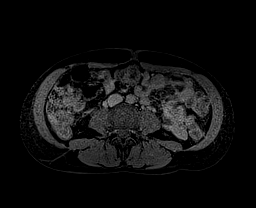
[im 30/60]
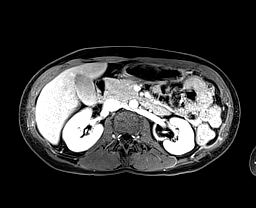
[im 60/60]
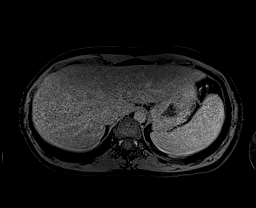

[Series 19: post axial 3 · axial · 2.3mm · 1.41mm/px · z∈[-37,+99]mm · 3 of 60 slices shown (2 of 2)]
[im 1/60]
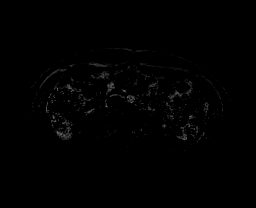
[im 30/60]
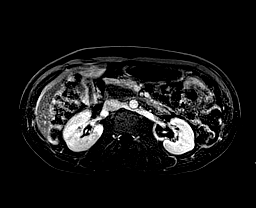
[im 60/60]
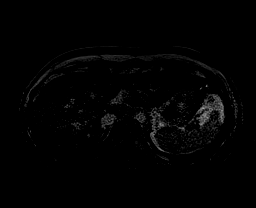

[Series 20: post axial 5 · axial · 2.3mm · 1.41mm/px · z∈[-37,+99]mm · 3 of 60 slices shown (1 of 2)]
[im 1/60]
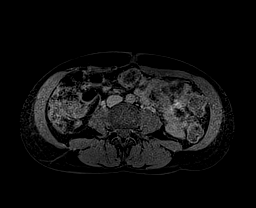
[im 30/60]
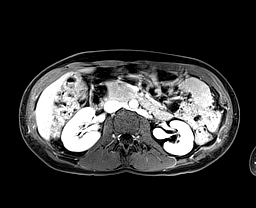
[im 60/60]
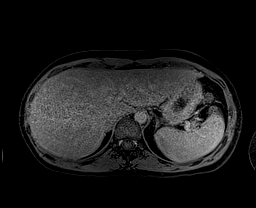

[Series 21: post axial 5 · axial · 2.3mm · 1.41mm/px · z∈[-37,+99]mm · 3 of 60 slices shown (2 of 2)]
[im 1/60]
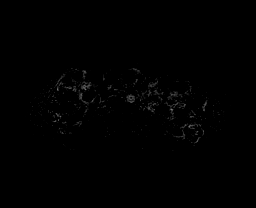
[im 30/60]
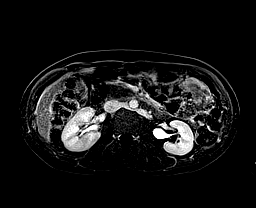
[im 60/60]
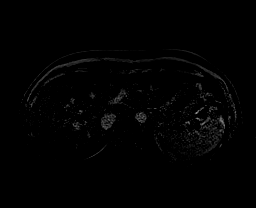

[48 of 48 positions shown; findings below may reference images not displayed]

FINDINGS: Lower chest: No acute findings.

Hepatobiliary: No mass or other parenchymal abnormality identified.
No gallstones. No biliary ductal dilatation.

Pancreas: No mass, inflammatory changes, or other parenchymal
abnormality identified.No pancreatic ductal dilatation.

Spleen:  Within normal limits in size and appearance.

Adrenals/Urinary Tract: Normal adrenal glands. There is a thinly
septated, benign cyst of the superior pole of the right kidney
measuring fall [DATE] x 1.3 cm with layering internal debris (series 9,
image 13). There is no associated solid component or contrast
enhancement. No solid renal masses or suspicious contrast
enhancement identified. No evidence of hydronephrosis.

Stomach/Bowel: Visualized portions within the abdomen are
unremarkable.

Vascular/Lymphatic: No pathologically enlarged lymph nodes
identified. No abdominal aortic aneurysm demonstrated.

Other:  None.

Musculoskeletal: No suspicious osseous lesions identified.
IMPRESSION: Thinly septated, benign cyst of the superior pole of the right
kidney measuring 1.5 x 1.3 cm with layering internal debris, in
keeping with findings of cyst containing calcific debris on prior
examination. There is no associated solid component or contrast
enhancement. No further follow-up or characterization is required
for this benign cyst.

## 2024-05-09 ENCOUNTER — Other Ambulatory Visit (HOSPITAL_BASED_OUTPATIENT_CLINIC_OR_DEPARTMENT_OTHER): Payer: Self-pay

## 2024-05-09 MED ORDER — FLUZONE 0.5 ML IM SUSY
0.5000 mL | PREFILLED_SYRINGE | Freq: Once | INTRAMUSCULAR | 0 refills | Status: AC
Start: 2024-05-09 — End: 2024-05-10
  Filled 2024-05-09: qty 0.5, 1d supply, fill #0
